# Patient Record
Sex: Female | Born: 1995 | Race: Black or African American | Hispanic: No | Marital: Single | Smoking: Never smoker
Health system: Southern US, Community
[De-identification: ages and names within clinical notes are randomized; demographics above are authoritative.]

## PROBLEM LIST (undated history)

## (undated) DIAGNOSIS — J45909 Unspecified asthma, uncomplicated: Secondary | ICD-10-CM

---

## 2016-05-18 DIAGNOSIS — J45909 Unspecified asthma, uncomplicated: Secondary | ICD-10-CM | POA: Insufficient documentation

## 2018-02-23 DIAGNOSIS — G43909 Migraine, unspecified, not intractable, without status migrainosus: Secondary | ICD-10-CM | POA: Insufficient documentation

## 2018-10-05 ENCOUNTER — Other Ambulatory Visit: Payer: Self-pay

## 2018-10-05 ENCOUNTER — Ambulatory Visit (HOSPITAL_COMMUNITY)
Admission: EM | Admit: 2018-10-05 | Discharge: 2018-10-05 | Disposition: A | Discharge status: Home / Self Care | Payer: BC Managed Care – PPO | Attending: Family Medicine | Admitting: Family Medicine

## 2018-10-05 ENCOUNTER — Encounter (HOSPITAL_COMMUNITY): Payer: Self-pay

## 2018-10-05 DIAGNOSIS — N898 Other specified noninflammatory disorders of vagina: Secondary | ICD-10-CM | POA: Diagnosis not present

## 2018-10-05 DIAGNOSIS — Z3202 Encounter for pregnancy test, result negative: Secondary | ICD-10-CM | POA: Diagnosis not present

## 2018-10-05 DIAGNOSIS — N760 Acute vaginitis: Secondary | ICD-10-CM | POA: Diagnosis not present

## 2018-10-05 HISTORY — DX: Unspecified asthma, uncomplicated: J45.909

## 2018-10-05 LAB — POCT URINALYSIS DIP (DEVICE)
Bilirubin Urine: NEGATIVE
Glucose, UA: NEGATIVE mg/dL
Hgb urine dipstick: NEGATIVE
Ketones, ur: NEGATIVE mg/dL
Leukocytes,Ua: NEGATIVE
Nitrite: NEGATIVE
Protein, ur: NEGATIVE mg/dL
Specific Gravity, Urine: >=1.03 (ref 1.005–1.030)
Urobilinogen, UA: 0.2 mg/dL (ref 0.0–1.0)
pH: 6 (ref 5.0–8.0)

## 2018-10-05 LAB — POCT PREGNANCY, URINE: Preg Test, Ur: NEGATIVE

## 2018-10-05 MED ORDER — FLUCONAZOLE 150 MG PO TABS: 150.0000 mg | ORAL_TABLET | Freq: Once | ORAL | 0 refills | Status: AC

## 2018-10-05 MED ORDER — METRONIDAZOLE 500 MG PO TABS: 500.0000 mg | ORAL_TABLET | Freq: Two times a day (BID) | ORAL | 0 refills | Status: AC

## 2018-10-05 NOTE — ED Triage Notes (Signed)
Pt states she has a clear discharge and having cramps x 2 days.

## 2018-10-05 NOTE — Discharge Instructions (Signed)
Please begin taking metronidazole twice daily for the next week.  Please not drink alcohol until 24 hours after the last tablet.  This will treat for BV. You may take 1 tablet of Diflucan today, may repeat when finishing course of Flagyl if still having symptoms.  We are testing you for Gonorrhea, Chlamydia, Trichomonas, Yeast and Bacterial Vaginosis. We will call you if anything is positive and let you know if you require any further treatment. Please inform partners of any positive results.   Please return if symptoms not improving with treatment, development of fever, nausea, vomiting, abdominal pain.

## 2018-10-05 NOTE — ED Provider Notes (Signed)
[facility]    CSN: [id number] Arrival date & time: 10/05/18  0845      History   Chief Complaint Chief Complaint  Patient presents with  . Vaginal Discharge    HPI Chelsea Underwood is a 23 y.o. female history of asthma presenting today for evaluation of vaginal discharge.  Patient states that over the past 2 days she has had an increasing amount of discharge.  She has had associated itching and some mild lower abdominal cramping.  She states that it feels like menstrual cramping but very mild.  Has had some slight nausea, but this is also been mild.  Denies vomiting.  Has maintained appetite and normal oral intake.  Denies change in bowel movements.  Last menstrual cycle was 7/25.  Is not on any form of birth control.  Denies concerns for STDs that she is in a monogamous relationship.  Denies fevers.  Denies rashes or lesions.  States it feels like BV or yeast, but she is not sure which.  Has history of both.  HPI  Past Medical History:  Diagnosis Date  . Asthma     There are no active problems to display for this patient.   History reviewed. No pertinent surgical history.  OB History   No obstetric history on file.      Home Medications    Prior to Admission medications   Medication Sig Start Date End Date Taking? Authorizing Provider  fluconazole (DIFLUCAN) 150 MG tablet Take 1 tablet (150 mg total) by mouth once for 1 dose. 10/05/18 10/05/18  Kimi Bordeau C, PA-C  metroNIDAZOLE (FLAGYL) 500 MG tablet Take 1 tablet (500 mg total) by mouth 2 (two) times daily for 7 days. 10/05/18 10/12/18  Dyron Kawano, Junius CreamerHallie C, PA-C    Family History History reviewed. No pertinent family history.  Social History Social History   Tobacco Use  . Smoking status: Never Smoker  . Smokeless tobacco: Never Used  Substance Use Topics  . Alcohol use: Yes  . Drug use: Not on file     Allergies   Amoxil [amoxicillin], Cefdinir, Peanut-containing drug products, and  Penicillins   Review of Systems Review of Systems  Constitutional: Negative for fever.  Respiratory: Negative for shortness of breath.   Cardiovascular: Negative for chest pain.  Gastrointestinal: Positive for abdominal pain. Negative for diarrhea, nausea and vomiting.  Genitourinary: Positive for vaginal discharge. Negative for dysuria, flank pain, genital sores, hematuria, menstrual problem, vaginal bleeding and vaginal pain.  Musculoskeletal: Negative for back pain.  Skin: Negative for rash.  Neurological: Negative for dizziness, light-headedness and headaches.     Physical Exam Triage Vital Signs ED Triage Vitals  Enc Vitals Group     BP 10/05/18 0941 115/71     Pulse Rate 10/05/18 0941 77     Resp 10/05/18 0941 16     Temp 10/05/18 0941 98.2 F (36.8 C)     Temp Source 10/05/18 0941 Oral     SpO2 10/05/18 0941 98 %     Weight 10/05/18 0937 203 lb (92.1 kg)     Height --      Head Circumference --      Peak Flow --      Pain Score 10/05/18 0937 3     Pain Loc --      Pain Edu? --      Excl. in [place]? --    No data found.  Updated Vital Signs BP 115/71 (BP Location: Right Arm)  Pulse 77   Temp 98.2 F (36.8 C) (Oral)   Resp 16   Wt 203 lb (92.1 kg)   LMP 09/12/2018   SpO2 98%   Visual Acuity Right Eye Distance:   Left Eye Distance:   Bilateral Distance:    Right Eye Near:   Left Eye Near:    Bilateral Near:     Physical Exam Vitals signs and nursing note reviewed.  Constitutional:      General: She is not in acute distress.    Appearance: She is well-developed.  HENT:     Head: Normocephalic and atraumatic.  Eyes:     Conjunctiva/sclera: Conjunctivae normal.  Neck:     Musculoskeletal: Neck supple.  Cardiovascular:     Rate and Rhythm: Normal rate and regular rhythm.     Heart sounds: No murmur.  Pulmonary:     Effort: Pulmonary effort is normal. No respiratory distress.     Breath sounds: Normal breath sounds.  Abdominal:     Palpations:  Abdomen is soft.     Tenderness: There is no abdominal tenderness.     Comments: Soft, nondistended, nontender light deep palpation throughout entire abdomen  Genitourinary:    Comments: Deferred Skin:    General: Skin is warm and dry.  Neurological:     Mental Status: She is alert.      UC Treatments / Results  Labs (all labs ordered are listed, but only abnormal results are displayed) Labs Reviewed  POC URINE PREG, ED  POCT URINALYSIS DIP (DEVICE)  POCT PREGNANCY, URINE  CERVICOVAGINAL ANCILLARY ONLY    EKG   Radiology No results found.  Procedures Procedures (including critical care time)  Medications Ordered in UC Medications - No data to display  Initial Impression / Assessment and Plan / UC Course  I have reviewed the triage vital signs and the nursing notes.  Pertinent labs & imaging results that were available during my care of the patient were reviewed by me and considered in my medical decision making (see chart for details).    Will empirically treat for yeast and BV today.  UA negative, pregnancy negative.  Swab obtained to confirm, will also check for STDs.  Will provide further treatment if needed.Discussed strict return precautions. Patient verbalized understanding and is agreeable with plan.  Final Clinical Impressions(s) / UC Diagnoses   Final diagnoses:  Acute vaginitis  Vaginal discharge     Discharge Instructions     Please begin taking metronidazole twice daily for the next week.  Please not drink alcohol until 24 hours after the last tablet.  This will treat for BV. You may take 1 tablet of Diflucan today, may repeat when finishing course of Flagyl if still having symptoms.  We are testing you for Gonorrhea, Chlamydia, Trichomonas, Yeast and Bacterial Vaginosis. We will call you if anything is positive and let you know if you require any further treatment. Please inform partners of any positive results.   Please return if symptoms not  improving with treatment, development of fever, nausea, vomiting, abdominal pain.    ED Prescriptions    Medication Sig Dispense Auth. Provider   fluconazole (DIFLUCAN) 150 MG tablet Take 1 tablet (150 mg total) by mouth once for 1 dose. 2 tablet Gerlene Glassburn C, PA-C   metroNIDAZOLE (FLAGYL) 500 MG tablet Take 1 tablet (500 mg total) by mouth 2 (two) times daily for 7 days. 14 tablet Lennyn Bellanca, [place] C, PA-C     Controlled Substance Prescriptions [place]  Controlled Substance Registry consulted? Not Applicable   Janith Lima, [place] 10/05/18 1022

## 2018-10-07 LAB — CERVICOVAGINAL ANCILLARY ONLY
Bacterial vaginitis: POSITIVE — AB
Candida vaginitis: POSITIVE — AB
Chlamydia: NEGATIVE
Neisseria Gonorrhea: NEGATIVE
Trichomonas: NEGATIVE

## 2019-01-23 ENCOUNTER — Encounter (HOSPITAL_COMMUNITY): Payer: Self-pay

## 2019-01-23 ENCOUNTER — Ambulatory Visit (HOSPITAL_COMMUNITY)
Admission: EM | Admit: 2019-01-23 | Discharge: 2019-01-23 | Disposition: A | Discharge status: Home / Self Care | Payer: BC Managed Care – PPO | Attending: Family Medicine | Admitting: Family Medicine

## 2019-01-23 ENCOUNTER — Ambulatory Visit
Admission: RE | Admit: 2019-01-23 | Discharge: 2019-01-23 | Disposition: A | Discharge status: Home / Self Care | Payer: BC Managed Care – PPO | Source: Ambulatory Visit

## 2019-01-23 ENCOUNTER — Other Ambulatory Visit: Payer: Self-pay

## 2019-01-23 DIAGNOSIS — Z20828 Contact with and (suspected) exposure to other viral communicable diseases: Secondary | ICD-10-CM | POA: Insufficient documentation

## 2019-01-23 DIAGNOSIS — R519 Headache, unspecified: Secondary | ICD-10-CM | POA: Insufficient documentation

## 2019-01-23 DIAGNOSIS — R110 Nausea: Secondary | ICD-10-CM | POA: Diagnosis not present

## 2019-01-23 DIAGNOSIS — Z3202 Encounter for pregnancy test, result negative: Secondary | ICD-10-CM | POA: Diagnosis not present

## 2019-01-23 DIAGNOSIS — Z20822 Contact with and (suspected) exposure to covid-19: Secondary | ICD-10-CM

## 2019-01-23 LAB — POCT PREGNANCY, URINE: Preg Test, Ur: NEGATIVE

## 2019-01-23 LAB — POC URINE PREG, ED: Preg Test, Ur: NEGATIVE

## 2019-01-23 MED ORDER — ONDANSETRON HCL 4 MG PO TABS: 4.0000 mg | ORAL_TABLET | Freq: Four times a day (QID) | ORAL | 0 refills | Status: DC

## 2019-01-23 MED ORDER — BUTALBITAL-APAP-CAFFEINE 50-325-40 MG PO TABS: 1.0000 | ORAL_TABLET | Freq: Four times a day (QID) | ORAL | 0 refills | Status: AC | PRN

## 2019-01-23 NOTE — ED Provider Notes (Signed)
[facility]    CSN: [id number] Arrival date & time: 01/23/19  1528      History   Chief Complaint Chief Complaint  Patient presents with  . Migraine    HPI Chelsea Underwood is a 23 y.o. female.   HPI  Patient's had a headache for 2 weeks.  She states it is worse than her usual headaches.  Itself is on the left side.  Its they are present almost every day.  She has tried ibuprofen 800 mg.  She has tried Excedrin Migraine.  Neither of these things have helped.  She denies head injury.  Denies infection symptoms such as cough cold runny nose or sinus drainage.  No fever chills or body aches.  No concern for coronavirus.  I explained her that headache can be coronavirus and she agrees to coronavirus testing. Patient states that she has some mild nausea.  No vomiting.  No visual changes.  No neuro changes.  No head injury.  Past Medical History:  Diagnosis Date  . Asthma     There are no active problems to display for this patient.   History reviewed. No pertinent surgical history.  OB History   No obstetric history on file.      Home Medications    Prior to Admission medications   Medication Sig Start Date End Date Taking? Authorizing Provider  butalbital-acetaminophen-caffeine (FIORICET) 50-325-40 MG tablet Take 1-2 tablets by mouth every 6 (six) hours as needed for headache. 01/23/19 01/23/20  Eustace MooreNelson, Curties Conigliaro Sue, MD  ondansetron (ZOFRAN) 4 MG tablet Take 1 tablet (4 mg total) by mouth every 6 (six) hours. 01/23/19   Eustace MooreNelson, Angelicia Lessner Sue, MD    Family History History reviewed. No pertinent family history.  Social History Social History   Tobacco Use  . Smoking status: Never Smoker  . Smokeless tobacco: Never Used  Substance Use Topics  . Alcohol use: Yes  . Drug use: Not on file     Allergies   Amoxil [amoxicillin], Cefdinir, Peanut-containing drug products, and Penicillins   Review of Systems Review of Systems  Constitutional: Negative  for chills and fever.  HENT: Negative for ear pain and sore throat.   Eyes: Negative for pain and visual disturbance.  Respiratory: Negative for cough and shortness of breath.   Cardiovascular: Negative for chest pain and palpitations.  Gastrointestinal: Positive for nausea. Negative for abdominal pain and vomiting.  Genitourinary: Negative for dysuria and hematuria.  Musculoskeletal: Negative for arthralgias and back pain.  Skin: Negative for color change and rash.  Neurological: Positive for headaches. Negative for seizures and syncope.  All other systems reviewed and are negative.    Physical Exam Triage Vital Signs ED Triage Vitals  Enc Vitals Group     BP 01/23/19 1629 98/78     Pulse Rate 01/23/19 1629 82     Resp 01/23/19 1629 17     Temp 01/23/19 1629 98.9 F (37.2 C)     Temp Source 01/23/19 1629 Oral     SpO2 01/23/19 1629 100 %     Weight --      Height --      Head Circumference --      Peak Flow --      Pain Score 01/23/19 1630 6     Pain Loc --      Pain Edu? --      Excl. in GC? --    No data found.  Updated Vital Signs BP 98/78 (BP Location:  Right Arm)   Pulse 82   Temp 98.9 F (37.2 C) (Oral)   Resp 17   LMP 12/27/2018   SpO2 100%      Physical Exam Constitutional:      General: She is not in acute distress.    Appearance: She is well-developed.  HENT:     Head: Normocephalic and atraumatic.     Right Ear: Tympanic membrane and ear canal normal.     Left Ear: Tympanic membrane and ear canal normal.     Nose: Nose normal. No congestion.     Mouth/Throat:     Mouth: Mucous membranes are moist.  Eyes:     Conjunctiva/sclera: Conjunctivae normal.     Pupils: Pupils are equal, round, and reactive to light.  Neck:     Musculoskeletal: Normal range of motion.  Cardiovascular:     Rate and Rhythm: Normal rate.     Heart sounds: Normal heart sounds.  Pulmonary:     Effort: Pulmonary effort is normal. No respiratory distress.     Breath  sounds: Normal breath sounds.  Abdominal:     General: There is no distension.     Palpations: Abdomen is soft.  Musculoskeletal: Normal range of motion.  Skin:    General: Skin is warm and dry.  Neurological:     General: No focal deficit present.     Mental Status: She is alert.     Cranial Nerves: No cranial nerve deficit.     Sensory: No sensory deficit.     Motor: No weakness.     Coordination: Coordination normal.     Gait: Gait normal.     Deep Tendon Reflexes: Reflexes normal.  Psychiatric:        Mood and Affect: Mood normal.        Behavior: Behavior normal.    Semination is unremarkable  UC Treatments / Results  Labs (all labs ordered are listed, but only abnormal results are displayed) Labs Reviewed  NOVEL CORONAVIRUS, NAA (HOSP ORDER, SEND-OUT TO REF LAB; TAT 18-24 HRS)  POC URINE PREG, ED  POCT PREGNANCY, URINE   Pregnancy test is negative EKG   Radiology No results found.  Procedures Procedures (including critical care time)  Medications Ordered in UC Medications - No data to display  Initial Impression / Assessment and Plan / UC Course  I have reviewed the triage vital signs and the nursing notes.  Pertinent labs & imaging results that were available during my care of the patient were reviewed by me and considered in my medical decision making (see chart for details).     Reviewed with patient her prescription medicine is stronger, and contains a controlled substance.  This will not be refilled. Needs PCP. Coronavirus send out test is pending.  Recommend quarantine until test result is available Final Clinical Impressions(s) / UC Diagnoses   Final diagnoses:  Bad headache  Suspected COVID-19 virus infection     Discharge Instructions     When you have a headache, take the headache medication and lie down to rest Take Zofran as needed for nausea Follow-up with your primary care doctor for medicine refills or additional work-up   ED  Prescriptions    Medication Sig Dispense Auth. Provider   butalbital-acetaminophen-caffeine (FIORICET) 50-325-40 MG tablet Take 1-2 tablets by mouth every 6 (six) hours as needed for headache. 25 tablet Raylene Everts, MD   ondansetron (ZOFRAN) 4 MG tablet Take 1 tablet (4 mg total) by mouth every  6 (six) hours. 12 tablet Eustace Moore, MD     I have reviewed the PDMP during this encounter.   Eustace Moore, MD 01/23/19 [phone]

## 2019-01-23 NOTE — ED Triage Notes (Signed)
Pt presents with ongoing migraine X 2 weeks with no relief with OTC medication.

## 2019-01-23 NOTE — Discharge Instructions (Signed)
When you have a headache, take the headache medication and lie down to rest Take Zofran as needed for nausea Follow-up with your primary care doctor for medicine refills or additional work-up

## 2019-01-25 LAB — NOVEL CORONAVIRUS, NAA (HOSP ORDER, SEND-OUT TO REF LAB; TAT 18-24 HRS): SARS-CoV-2, NAA: NOT DETECTED

## 2019-04-13 ENCOUNTER — Encounter: Payer: Self-pay | Admitting: Emergency Medicine

## 2019-04-13 ENCOUNTER — Emergency Department
Admission: EM | Admit: 2019-04-13 | Discharge: 2019-04-13 | Disposition: A | Discharge status: Home / Self Care | Payer: BC Managed Care – PPO | Attending: Emergency Medicine | Admitting: Emergency Medicine

## 2019-04-13 ENCOUNTER — Other Ambulatory Visit: Payer: Self-pay

## 2019-04-13 DIAGNOSIS — Y999 Unspecified external cause status: Secondary | ICD-10-CM | POA: Diagnosis not present

## 2019-04-13 DIAGNOSIS — Y9389 Activity, other specified: Secondary | ICD-10-CM | POA: Insufficient documentation

## 2019-04-13 DIAGNOSIS — Z91010 Allergy to peanuts: Secondary | ICD-10-CM | POA: Insufficient documentation

## 2019-04-13 DIAGNOSIS — S3992XA Unspecified injury of lower back, initial encounter: Secondary | ICD-10-CM | POA: Diagnosis present

## 2019-04-13 DIAGNOSIS — M546 Pain in thoracic spine: Secondary | ICD-10-CM

## 2019-04-13 DIAGNOSIS — T148XXA Other injury of unspecified body region, initial encounter: Secondary | ICD-10-CM

## 2019-04-13 DIAGNOSIS — S39012A Strain of muscle, fascia and tendon of lower back, initial encounter: Secondary | ICD-10-CM | POA: Insufficient documentation

## 2019-04-13 DIAGNOSIS — J45909 Unspecified asthma, uncomplicated: Secondary | ICD-10-CM | POA: Insufficient documentation

## 2019-04-13 DIAGNOSIS — X501XXA Overexertion from prolonged static or awkward postures, initial encounter: Secondary | ICD-10-CM | POA: Diagnosis not present

## 2019-04-13 DIAGNOSIS — Y9289 Other specified places as the place of occurrence of the external cause: Secondary | ICD-10-CM | POA: Insufficient documentation

## 2019-04-13 MED ORDER — METHOCARBAMOL 500 MG PO TABS: 500.0000 mg | ORAL_TABLET | Freq: Three times a day (TID) | ORAL | 0 refills | Status: DC

## 2019-04-13 MED ORDER — KETOROLAC TROMETHAMINE 30 MG/ML IJ SOLN
30.0000 mg | Freq: Once | INTRAMUSCULAR | Status: AC
  Administered 2019-04-13: 30 mg via INTRAMUSCULAR
  Filled 2019-04-13: qty 1

## 2019-04-13 MED ORDER — NAPROXEN 500 MG PO TABS: 500.0000 mg | ORAL_TABLET | Freq: Two times a day (BID) | ORAL | 2 refills | Status: DC

## 2019-04-13 NOTE — ED Provider Notes (Signed)
[facility] Emergency Department Provider Note   ____________________________________________    I have reviewed the triage vital signs and the nursing notes.   HISTORY  Chief Complaint Back Pain     HPI Chelsea Underwood is a 24 y.o. female who presents with complaints of back pain.  Patient describes lower thoracic back pain, worse on the right which is been present for 2 days.  She has been taking ibuprofen intermittently.  No neuro deficits.  No incontinence.  No abdominal pain.  No nausea or vomiting.  No IV drug use.  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 Asthma     There are no problems to display for this patient.   History reviewed. No pertinent surgical history.  Prior to Admission medications   Medication Sig Start Date End Date Taking? Authorizing Provider  butalbital-acetaminophen-caffeine (FIORICET) 50-325-40 MG tablet Take 1-2 tablets by mouth every 6 (six) hours as needed for headache. 01/23/19 01/23/20  Raylene Everts, MD  methocarbamol (ROBAXIN) 500 MG tablet Take 1 tablet (500 mg total) by mouth 3 (three) times daily. 04/13/19   Lavonia Drafts, MD  naproxen (NAPROSYN) 500 MG tablet Take 1 tablet (500 mg total) by mouth 2 (two) times daily with a meal. 04/13/19   Lavonia Drafts, MD  ondansetron (ZOFRAN) 4 MG tablet Take 1 tablet (4 mg total) by mouth every 6 (six) hours. 01/23/19   Raylene Everts, MD     Allergies Amoxil [amoxicillin], Cefdinir, Peanut-containing drug products, and Penicillins  No family history on file.  Social History Social History   Tobacco Use  . Smoking status: Never Smoker  . Smokeless tobacco: Never Used  Substance Use Topics  . Alcohol use: Yes  . Drug use: Not on file    Review of Systems  Constitutional: No fever/chills     Gastrointestinal: No abdominal pain.  No  nausea, no vomiting.   Genitourinary: Negative for incontinence, no dysuria Musculoskeletal: As above Skin: Negative for rash. Neurological: Negative for headaches, no neuro deficits    ____________________________________________   PHYSICAL EXAM:  VITAL SIGNS: ED Triage Vitals  Enc Vitals Group     BP 04/13/19 0106 100/62     Pulse Rate 04/13/19 0106 75     Resp 04/13/19 0106 20     Temp 04/13/19 0106 98 F (36.7 C)     Temp Source 04/13/19 0106 Oral     SpO2 04/13/19 0106 98 %     Weight 04/13/19 0106 96.2 kg (212 lb)     Height 04/13/19 0106 1.6 m (5\' 3" )     Head Circumference --      Peak Flow --      Pain Score 04/13/19 0105 8     Pain Loc --      Pain Edu? --      Excl. in [place]? --      Constitutional: Alert and oriented. No acute distress. Pleasant and interactive  Nose: No congestion/rhinnorhea. Mouth/Throat: Mucous membranes are moist.   Cardiovascular: Normal rate, regular rhythm.  Respiratory: Normal respiratory effort.  No retractions. Genitourinary: deferred Musculoskeletal: Back: No vertebral tenderness palpation, lower thoracic right paravertebral tenderness palpation consistent with sprain/spasm.  Normal strength in the lower extremities.  No saddle anesthesia Neurologic:  Normal speech and language. No gross focal neurologic deficits are appreciated.  Skin:  Skin is warm, dry and intact. No rash noted.   ____________________________________________   LABS (all labs ordered are listed, but only abnormal results are displayed)  Labs Reviewed - No data to display ____________________________________________  EKG   ____________________________________________  RADIOLOGY  None ____________________________________________   PROCEDURES  Procedure(s) performed: No  Procedures   Critical Care performed: No ____________________________________________   INITIAL IMPRESSION / ASSESSMENT AND PLAN / ED COURSE  Pertinent labs & imaging  results that were available during my care of the patient were reviewed by me and considered in my medical decision making (see chart for details).  Patient symptoms consistent with muscle strain, no alarming symptoms.  Will treat conservatively with Naprosyn and muscle relaxer, outpatient follow-up recommended.   ____________________________________________   FINAL CLINICAL IMPRESSION(S) / ED DIAGNOSES  Final diagnoses:  Muscle strain  Acute right-sided thoracic back pain      NEW MEDICATIONS STARTED DURING THIS VISIT:  Discharge Medication List as of 04/13/2019  2:08 AM    START taking these medications   Details  methocarbamol (ROBAXIN) 500 MG tablet Take 1 tablet (500 mg total) by mouth 3 (three) times daily., Starting Thu 04/13/2019, Normal    naproxen (NAPROSYN) 500 MG tablet Take 1 tablet (500 mg total) by mouth 2 (two) times daily with a meal., Starting Thu 04/13/2019, Normal         Note:  This document was prepared using Dragon voice recognition software and may include unintentional dictation errors.   Jene Every, MD 04/13/19 [phone]

## 2019-04-13 NOTE — ED Triage Notes (Signed)
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

## 2019-04-13 NOTE — ED Notes (Signed)
Per patient having back pain from base of neck to middle of back with pain shooting down to lower back. Pain is on right side. No relief with OTC medications. Pain 8/10.

## 2020-04-06 ENCOUNTER — Emergency Department: Payer: BC Managed Care – PPO

## 2020-04-06 ENCOUNTER — Emergency Department
Admission: EM | Admit: 2020-04-06 | Discharge: 2020-04-06 | Disposition: A | Discharge status: Home / Self Care | Payer: BC Managed Care – PPO | Attending: Emergency Medicine | Admitting: Emergency Medicine

## 2020-04-06 ENCOUNTER — Other Ambulatory Visit: Payer: Self-pay

## 2020-04-06 DIAGNOSIS — Z91010 Allergy to peanuts: Secondary | ICD-10-CM | POA: Insufficient documentation

## 2020-04-06 DIAGNOSIS — J45909 Unspecified asthma, uncomplicated: Secondary | ICD-10-CM | POA: Diagnosis not present

## 2020-04-06 DIAGNOSIS — S46011A Strain of muscle(s) and tendon(s) of the rotator cuff of right shoulder, initial encounter: Secondary | ICD-10-CM | POA: Diagnosis not present

## 2020-04-06 DIAGNOSIS — S4991XA Unspecified injury of right shoulder and upper arm, initial encounter: Secondary | ICD-10-CM | POA: Diagnosis present

## 2020-04-06 DIAGNOSIS — Y9351 Activity, roller skating (inline) and skateboarding: Secondary | ICD-10-CM | POA: Diagnosis not present

## 2020-04-06 DIAGNOSIS — W19XXXA Unspecified fall, initial encounter: Secondary | ICD-10-CM

## 2020-04-06 DIAGNOSIS — S46911A Strain of unspecified muscle, fascia and tendon at shoulder and upper arm level, right arm, initial encounter: Secondary | ICD-10-CM

## 2020-04-06 IMAGING — CR DG SHOULDER 2+V*R*
1 series · 3 of 3 positions shown · non-contrast
Comparison: None.

CLINICAL DATA: Fell while roller skating.  Shoulder pain.

EXAM:
RIGHT SHOULDER - 2+ VIEW

[Series 1: dg shoulder right · 0.14mm/px · 3 of 3 slices shown]
[im 1/3]
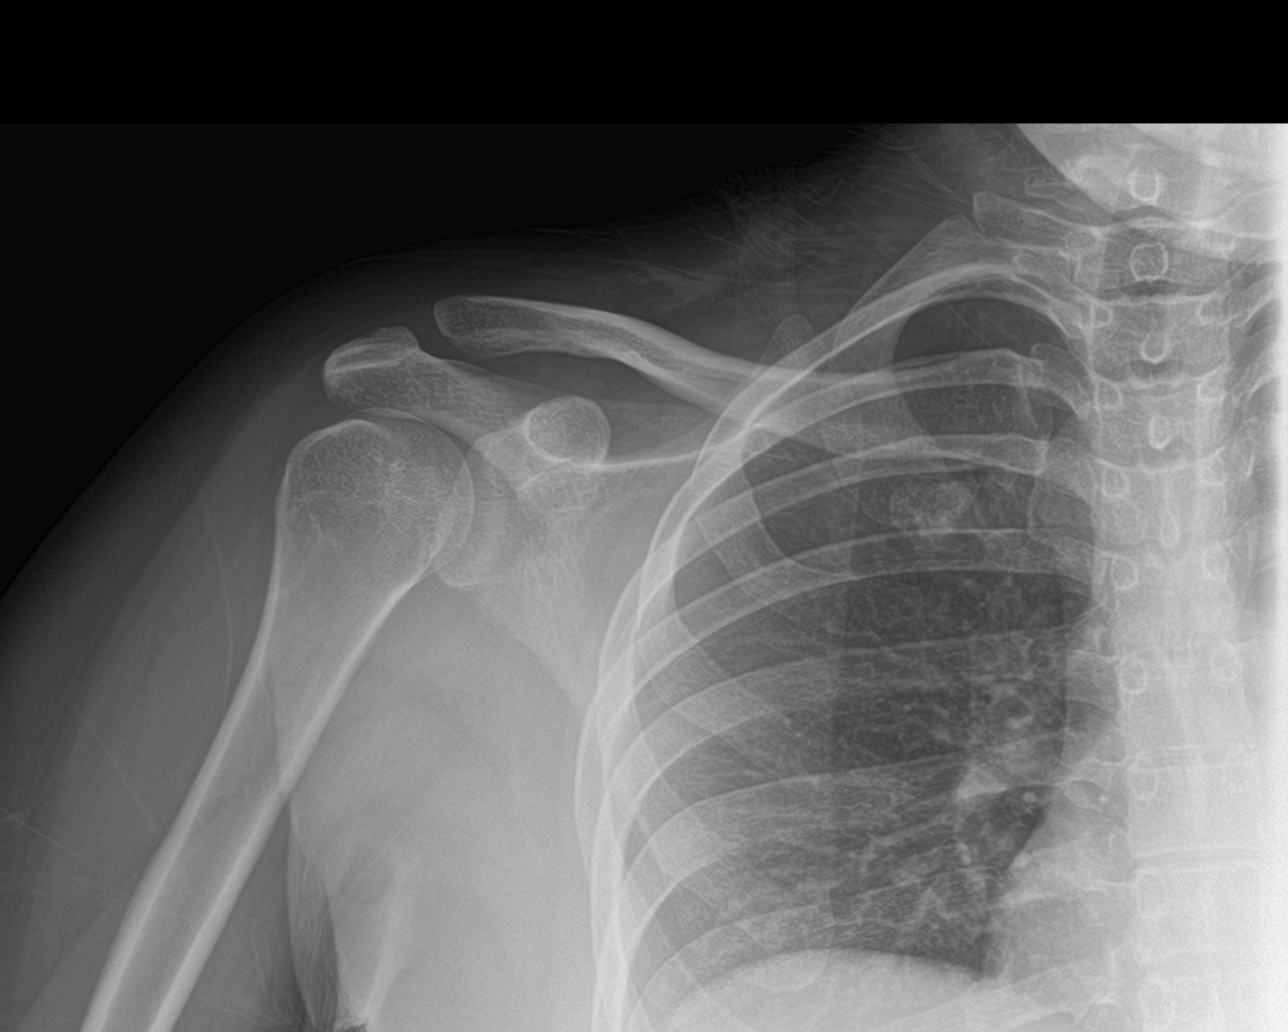
[im 2/3]
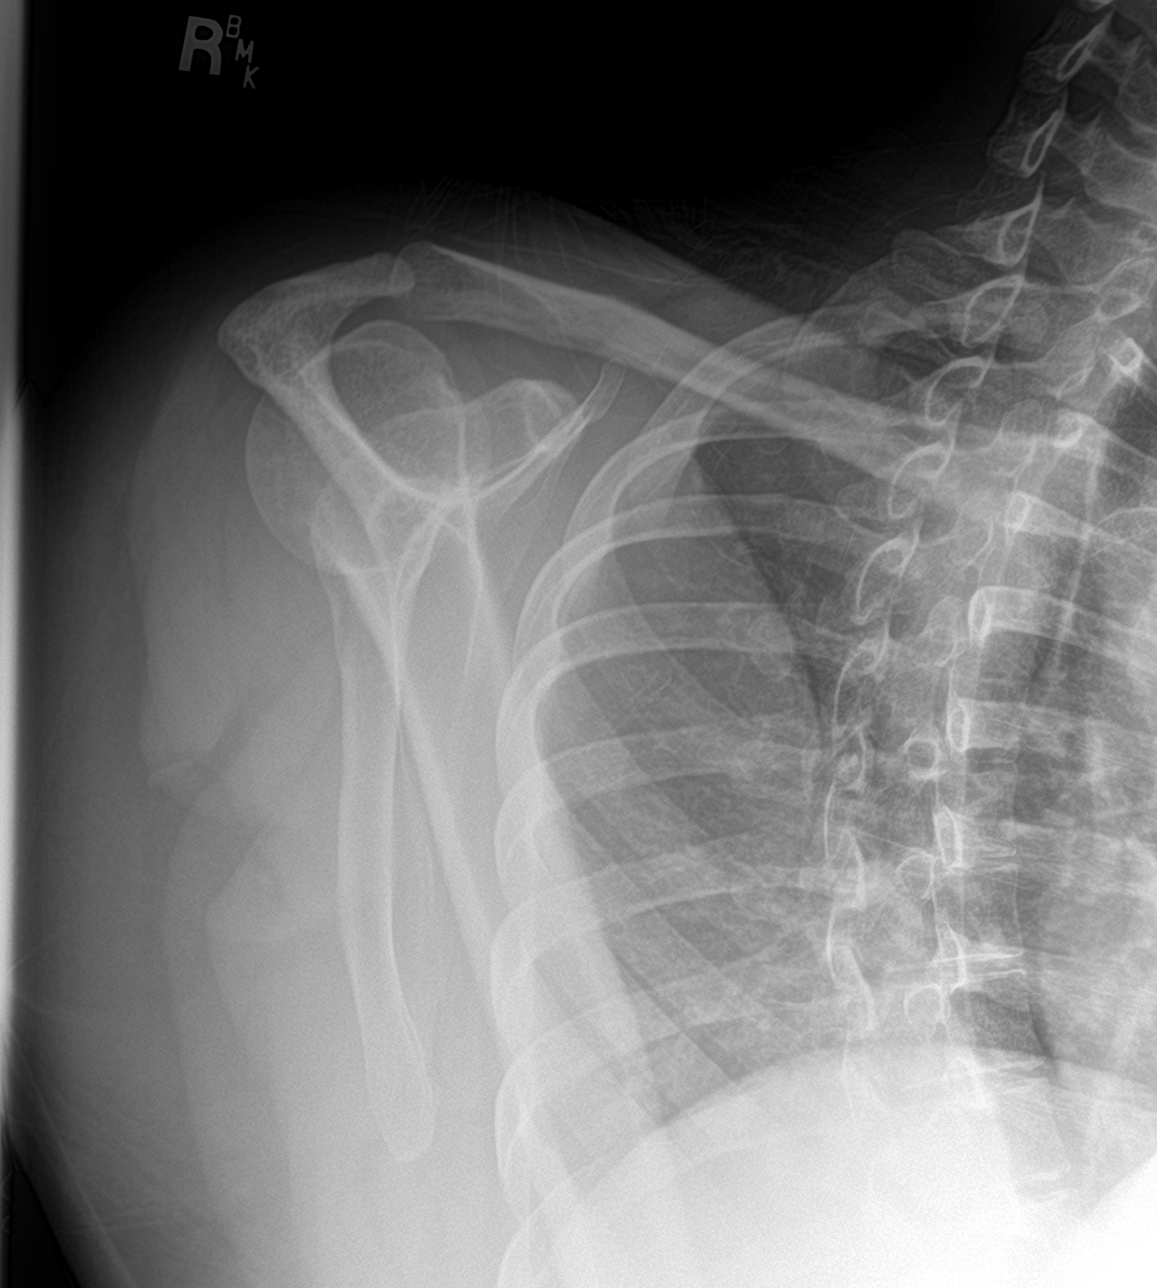
[im 3/3]
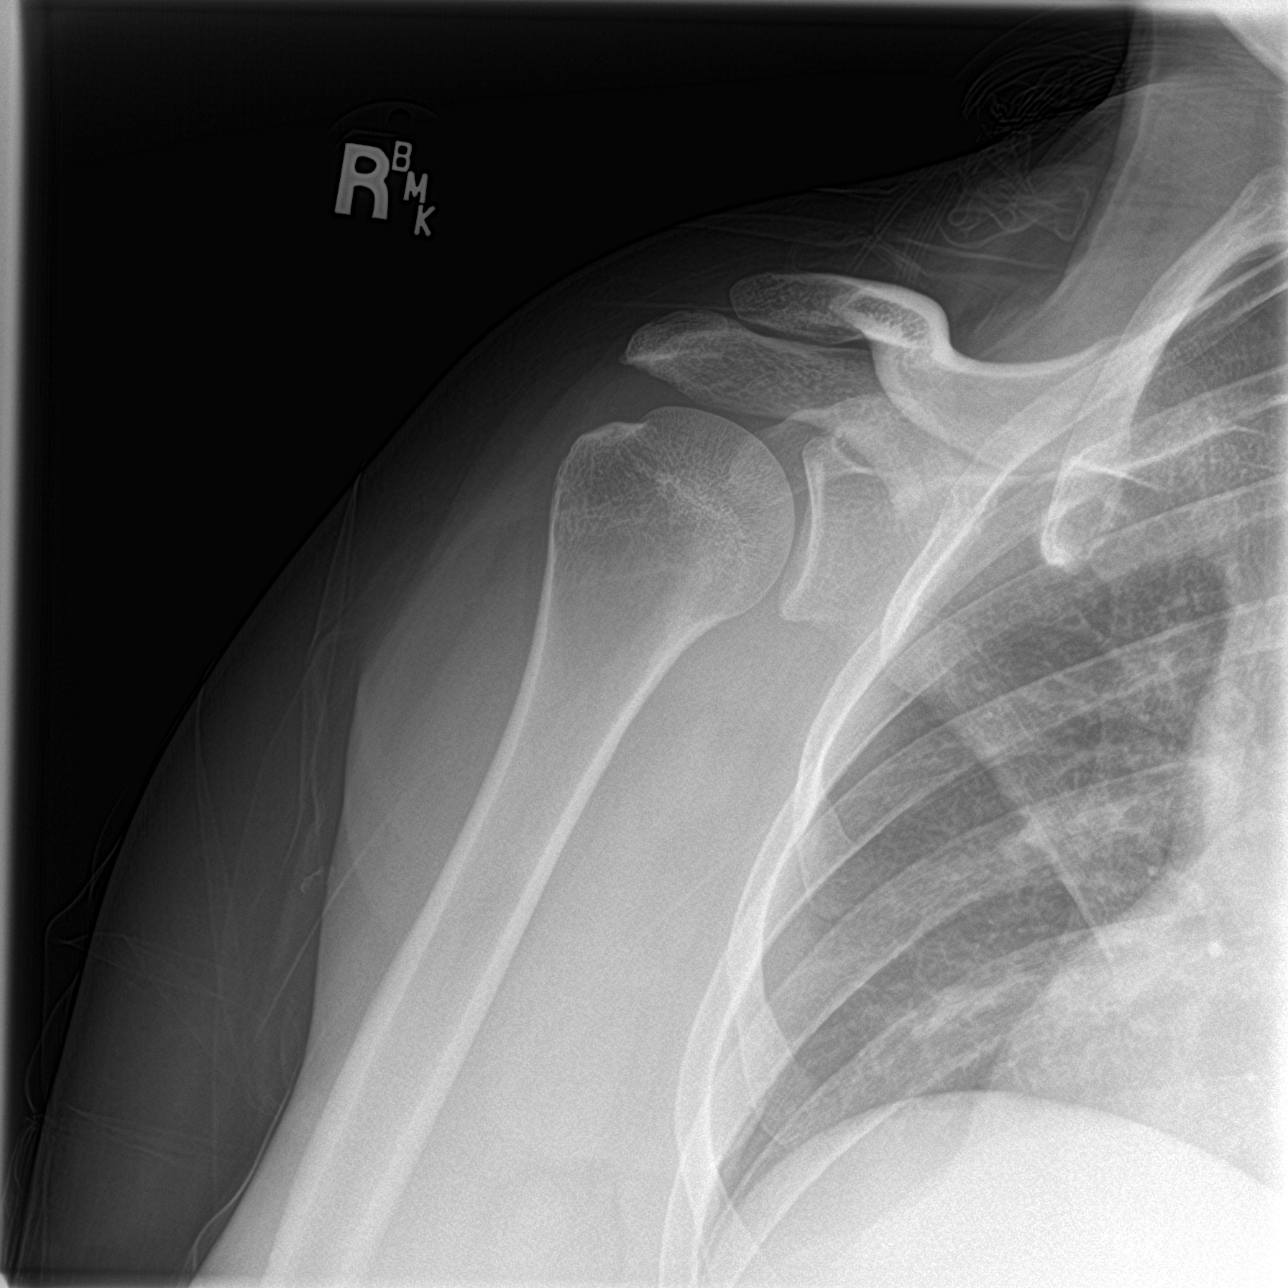

[3 of 3 positions shown; findings below may reference images not displayed]

FINDINGS: Glenohumeral joint is normal. No evidence of regional fracture.
Borderline minimal widening of the AC joint without elevation of
clavicle could indicate low-grade AC ligament strain.
IMPRESSION: No fracture or dislocation. Borderline widening of the AC joint
could indicate low-grade AC ligament strain.

## 2020-04-06 MED ORDER — IBUPROFEN 600 MG PO TABS
600.0000 mg | ORAL_TABLET | Freq: Once | ORAL | Status: AC
  Administered 2020-04-06: 600 mg via ORAL
  Filled 2020-04-06: qty 1

## 2020-04-06 MED ORDER — IBUPROFEN 600 MG PO TABS: 600.0000 mg | ORAL_TABLET | Freq: Four times a day (QID) | ORAL | 0 refills | Status: DC | PRN

## 2020-04-06 NOTE — ED Provider Notes (Signed)
[facility] Emergency Department Provider Note  ____________________________________________  Time seen: Approximately 1:58 AM  I have reviewed the triage vital signs and the nursing notes.   HISTORY  Chief Complaint Fall   HPI Chelsea Underwood is a 25 y.o. female who presents for evaluation of right shoulder pain status post mechanical fall.  Patient was rollerskating when she fell onto her shoulder.  Pain is worse with movement and has been constant since the fall.  Pain is moderate in intensity.  She denies head trauma or LOC.  No neck pain or back pain.  No other extremity pain.  No chest pain or abdominal pain   Past Medical History:  Diagnosis Date  . Asthma      Prior to Admission medications   Medication Sig Start Date End Date Taking? Authorizing Provider  ibuprofen (ADVIL) 600 MG tablet Take 1 tablet (600 mg total) by mouth every 6 (six) hours as needed. 04/06/20  Yes Don Perking, [place], MD  methocarbamol (ROBAXIN) 500 MG tablet Take 1 tablet (500 mg total) by mouth 3 (three) times daily. 04/13/19   Jene Every, MD  naproxen (NAPROSYN) 500 MG tablet Take 1 tablet (500 mg total) by mouth 2 (two) times daily with a meal. 04/13/19   Jene Every, MD  ondansetron (ZOFRAN) 4 MG tablet Take 1 tablet (4 mg total) by mouth every 6 (six) hours. 01/23/19   Eustace Moore, MD    Allergies Amoxil [amoxicillin], Cefdinir, Peanut-containing drug products, and Penicillins  No family history on file.  Social History Social History   Tobacco Use  . Smoking status: Never Smoker  . Smokeless tobacco: Never Used  Substance Use Topics  . Alcohol use: Yes    Review of Systems  Constitutional: Negative for fever. Eyes: Negative for visual changes. ENT: Negative for facial injury or neck injury Cardiovascular: Negative for chest injury. Respiratory: Negative for shortness of breath. Negative for chest wall injury. Gastrointestinal: Negative  for abdominal pain or injury. Genitourinary: Negative for dysuria. Musculoskeletal: Negative for back injury, + R shoulder pain. Skin: Negative for laceration/abrasions. Neurological: Negative for head injury.   ____________________________________________   PHYSICAL EXAM:  VITAL SIGNS: ED Triage Vitals  Enc Vitals Group     BP 04/06/20 0056 125/77     Pulse Rate 04/06/20 0056 99     Resp 04/06/20 0056 17     Temp 04/06/20 0056 98.8 F (37.1 C)     Temp Source 04/06/20 0056 Oral     SpO2 04/06/20 0056 100 %     Weight 04/06/20 0057 210 lb (95.3 kg)     Height 04/06/20 0057 5\' 3"  (1.6 m)     Head Circumference --      Peak Flow --      Pain Score 04/06/20 0057 8     Pain Loc --      Pain Edu? --      Excl. in GC? --     Full spinal precautions maintained throughout the trauma exam. Constitutional: Alert and oriented. No acute distress. Does not appear intoxicated. HEENT Head: Normocephalic and atraumatic. Face: No facial bony tenderness. Stable midface Ears: No hemotympanum bilaterally. No Battle sign Eyes: No eye injury. PERRL. No raccoon eyes Nose: Nontender. No epistaxis. No rhinorrhea Mouth/Throat: Mucous membranes are moist. No oropharyngeal blood. No dental injury. Airway patent without stridor. Normal voice. Neck: no C-collar. No midline c-spine tenderness.  Cardiovascular: Normal rate, regular rhythm. Normal and symmetric distal pulses are present in  all extremities. Pulmonary/Chest: Chest wall is stable and nontender to palpation/compression. Normal respiratory effort. Breath sounds are normal. No crepitus.  Abdominal: Soft, nontender, non distended. Musculoskeletal: Tenderness to palpation on the anterior aspect of the right shoulder. nontender with normal full range of motion in all extremities. No deformities. No thoracic or lumbar midline spinal tenderness. Pelvis is stable. Skin: Skin is warm, dry and intact. No abrasions or contutions. Psychiatric: Speech  and behavior are appropriate. Neurological: Normal speech and language. Moves all extremities to command. No gross focal neurologic deficits are appreciated.  Glascow Coma Score: 4 - Opens eyes on own 6 - Follows simple motor commands 5 - Alert and oriented GCS: 15   ____________________________________________   LABS (all labs ordered are listed, but only abnormal results are displayed)  Labs Reviewed - No data to display ____________________________________________  EKG  none  ____________________________________________  RADIOLOGY  I have personally reviewed the images performed during this visit and I agree with the Radiologist's read.   Interpretation by Radiologist:  DG Shoulder Right  Result Date: 04/06/2020 CLINICAL DATA:  Larey Seat while roller skating.  Shoulder pain. EXAM: RIGHT SHOULDER - 2+ VIEW COMPARISON:  None. FINDINGS: Glenohumeral joint is normal. No evidence of regional fracture. Borderline minimal widening of the [facility] joint without elevation of clavicle could indicate low-grade AC ligament strain. IMPRESSION: No fracture or dislocation. Borderline widening of the [facility] joint could indicate low-grade AC ligament strain. Electronically Signed   By: Paulina Fusi M.D.   On: 04/06/2020 01:45     ____________________________________________   PROCEDURES  Procedure(s) performed: None Procedures Critical Care performed:  None ____________________________________________   INITIAL IMPRESSION / ASSESSMENT AND PLAN / ED COURSE   25 y.o. female who presents for evaluation of right shoulder pain status post mechanical fall.  No obvious deformity on exam.  Chest x-ray visualized by me showing no evidence of fracture or dislocation.  Questionable shoulder sprain.  Patient was given a sling, prescription for ibuprofen, discussed range of motion exercises starting in 24 hours.  In the meantime ice, rest, and anti-inflammatories.  Discussed my standard return  precautions.       ____________________________________________  Please note:  Patient was evaluated in Emergency Department today for the symptoms described in the history of present illness. Patient was evaluated in the context of the global COVID-19 pandemic, which necessitated consideration that the patient might be at risk for infection with the SARS-CoV-2 virus that causes COVID-19. Institutional protocols and algorithms that pertain to the evaluation of patients at risk for COVID-19 are in a state of rapid change based on information released by regulatory bodies including the CDC and federal and state organizations. These policies and algorithms were followed during the patient's care in the ED.  Some ED evaluations and interventions may be delayed as a result of limited staffing during the pandemic.   ____________________________________________   FINAL CLINICAL IMPRESSION(S) / ED DIAGNOSES   Final diagnoses:  Fall, initial encounter  Strain of right shoulder, initial encounter      NEW MEDICATIONS STARTED DURING THIS VISIT:  ED Discharge Orders         Ordered    ibuprofen (ADVIL) 600 MG tablet  Every 6 hours PRN        04/06/20 0158           Note:  This document was prepared using Dragon voice recognition software and may include unintentional dictation errors.    Don Perking, [place], MD 04/06/20 0201

## 2020-04-06 NOTE — ED Triage Notes (Signed)
Patient reports falling while roller skating.  Patient reports right shoulder pain.  Reports tenderness to right side of head but states doesn't remember hitting head.

## 2020-06-27 ENCOUNTER — Other Ambulatory Visit: Payer: Self-pay

## 2020-06-27 ENCOUNTER — Encounter: Payer: Self-pay | Admitting: Emergency Medicine

## 2020-06-27 ENCOUNTER — Emergency Department
Admission: EM | Admit: 2020-06-27 | Discharge: 2020-06-27 | Disposition: A | Discharge status: Home / Self Care | Payer: BC Managed Care – PPO | Attending: Emergency Medicine | Admitting: Emergency Medicine

## 2020-06-27 DIAGNOSIS — N390 Urinary tract infection, site not specified: Secondary | ICD-10-CM

## 2020-06-27 DIAGNOSIS — J45909 Unspecified asthma, uncomplicated: Secondary | ICD-10-CM | POA: Insufficient documentation

## 2020-06-27 DIAGNOSIS — Z91010 Allergy to peanuts: Secondary | ICD-10-CM | POA: Insufficient documentation

## 2020-06-27 DIAGNOSIS — R300 Dysuria: Secondary | ICD-10-CM | POA: Diagnosis present

## 2020-06-27 LAB — URINALYSIS, COMPLETE (UACMP) WITH MICROSCOPIC
Bilirubin Urine: NEGATIVE
Glucose, UA: NEGATIVE mg/dL
Ketones, ur: 5 mg/dL — AB
Nitrite: NEGATIVE
Protein, ur: 30 mg/dL — AB
Specific Gravity, Urine: 1.026 (ref 1.005–1.030)
pH: 5 (ref 5.0–8.0)

## 2020-06-27 LAB — POC URINE PREG, ED: Preg Test, Ur: NEGATIVE

## 2020-06-27 MED ORDER — NITROFURANTOIN MONOHYD MACRO 100 MG PO CAPS
100.0000 mg | ORAL_CAPSULE | Freq: Once | ORAL | Status: AC
  Administered 2020-06-27: 100 mg via ORAL
  Filled 2020-06-27: qty 1

## 2020-06-27 MED ORDER — NITROFURANTOIN MONOHYD MACRO 100 MG PO CAPS: 100.0000 mg | ORAL_CAPSULE | Freq: Two times a day (BID) | ORAL | 0 refills | Status: DC

## 2020-06-27 MED ORDER — PHENAZOPYRIDINE HCL 95 MG PO TABS: 95.0000 mg | ORAL_TABLET | Freq: Three times a day (TID) | ORAL | 0 refills | Status: DC | PRN

## 2020-06-27 NOTE — ED Triage Notes (Signed)
Pt arrived via POV with reports of dysuria x 2 days, pt taking AZO at home for the pain.

## 2020-06-27 NOTE — Discharge Instructions (Signed)
You may alternate Tylenol 1000 mg every 6 hours as needed for pain, fever and Ibuprofen 800 mg every 8 hours as needed for pain, fever.  Please take Ibuprofen with food.  Do not take more than 4000 mg of Tylenol (acetaminophen) in a 24 hour period.  

## 2020-06-28 NOTE — ED Provider Notes (Signed)
[facility] Emergency Department Provider Note  ____________________________________________   Event Date/Time   First MD Initiated Contact with Patient 06/27/20 2307     (approximate)  I have reviewed the triage vital signs and the nursing notes.   HISTORY  Chief Complaint Dysuria    HPI Chelsea Underwood is a 25 y.o. female with history of asthma who presents to the emergency department with complaints of 2 to 3 days of dysuria, urinary frequency and urgency, suprapubic pressure.  No fevers, flank pain, nausea, vomiting or diarrhea.  States she was concerned that she had a UTI.  No abnormal vaginal bleeding or discharge.  Last menstrual period 06/12/2020.  States her last UTI was years ago.  She cannot recall which antibiotic has helped her before.  States she has an allergy to amoxicillin and cefdinir.        Past Medical History:  Diagnosis Date  . Asthma     There are no problems to display for this patient.   History reviewed. No pertinent surgical history.  Prior to Admission medications   Medication Sig Start Date End Date Taking? Authorizing Provider  nitrofurantoin, macrocrystal-monohydrate, (MACROBID) 100 MG capsule Take 1 capsule (100 mg total) by mouth 2 (two) times daily for 7 days. 06/27/20 07/04/20 Yes Atira Borello, Layla Maw, DO  phenazopyridine (PYRIDIUM) 95 MG tablet Take 1 tablet (95 mg total) by mouth 3 (three) times daily as needed for pain. 06/27/20  Yes Lorette Peterkin, Layla Maw, DO  ibuprofen (ADVIL) 600 MG tablet Take 1 tablet (600 mg total) by mouth every 6 (six) hours as needed. 04/06/20   Nita Sickle, MD  methocarbamol (ROBAXIN) 500 MG tablet Take 1 tablet (500 mg total) by mouth 3 (three) times daily. 04/13/19   Jene Every, MD  naproxen (NAPROSYN) 500 MG tablet Take 1 tablet (500 mg total) by mouth 2 (two) times daily with a meal. 04/13/19   Jene Every, MD  ondansetron (ZOFRAN) 4 MG tablet Take 1 tablet (4 mg total) by mouth every  6 (six) hours. 01/23/19   Eustace Moore, MD    Allergies Amoxicillin, Cefdinir, Other, Peanut butter flavor, Peanut oil, Penicillin v, Apple, Peanut-containing drug products, and Penicillins  No family history on file.  Social History Social History   Tobacco Use  . Smoking status: Never Smoker  . Smokeless tobacco: Never Used  Substance Use Topics  . Alcohol use: Yes    Review of Systems Constitutional: No fever. Eyes: No visual changes. ENT: No sore throat. Cardiovascular: Denies chest pain. Respiratory: Denies shortness of breath. Gastrointestinal: No nausea, vomiting, diarrhea. Genitourinary: + dysuria. Musculoskeletal: Negative for back pain. Skin: Negative for rash. Neurological: Negative for focal weakness or numbness.  ____________________________________________   PHYSICAL EXAM:  VITAL SIGNS: ED Triage Vitals  Enc Vitals Group     BP 06/27/20 2117 129/69     Pulse Rate 06/27/20 2117 78     Resp 06/27/20 2117 16     Temp 06/27/20 2117 98.7 F (37.1 C)     Temp Source 06/27/20 2117 Oral     SpO2 06/27/20 2117 100 %     Weight 06/27/20 2123 203 lb (92.1 kg)     Height 06/27/20 2123 5\' 3"  (1.6 m)     Head Circumference --      Peak Flow --      Pain Score 06/27/20 2123 6     Pain Loc --      Pain Edu? --  Excl. in GC? --    CONSTITUTIONAL: Alert and oriented and responds appropriately to questions. Well-appearing; well-nourished HEAD: Normocephalic EYES: Conjunctivae clear, pupils appear equal, EOM appear intact ENT: normal nose; moist mucous membranes NECK: Supple, normal ROM CARD: RRR; S1 and S2 appreciated; no murmurs, no clicks, no rubs, no gallops RESP: Normal chest excursion without splinting or tachypnea; breath sounds clear and equal bilaterally; no wheezes, no rhonchi, no rales, no hypoxia or respiratory distress, speaking full sentences ABD/GI: Normal bowel sounds; non-distended; soft, non-tender, no rebound, no guarding, no  peritoneal signs, no hepatosplenomegaly BACK: The back appears normal EXT: Normal ROM in all joints; no deformity noted, no edema; no cyanosis SKIN: Normal color for age and race; warm; no rash on exposed skin NEURO: Moves all extremities equally PSYCH: The patient's mood and manner are appropriate.  ____________________________________________   LABS (all labs ordered are listed, but only abnormal results are displayed)  Labs Reviewed  URINALYSIS, COMPLETE (UACMP) WITH MICROSCOPIC - Abnormal; Notable for the following components:      Result Value   Color, Urine YELLOW (*)    APPearance HAZY (*)    Hgb urine dipstick MODERATE (*)    Ketones, ur 5 (*)    Protein, ur 30 (*)    Leukocytes,Ua TRACE (*)    Bacteria, UA RARE (*)    Non Squamous Epithelial PRESENT (*)    All other components within normal limits  URINE CULTURE  POC URINE PREG, ED   ____________________________________________  EKG   ____________________________________________  RADIOLOGY I, Shaquita Fort, personally viewed and evaluated these images (plain radiographs) as part of my medical decision making, as well as reviewing the written report by the radiologist.  ED MD interpretation:    Official radiology report(s): No results found.  ____________________________________________   PROCEDURES  Procedure(s) performed (including Critical Care):  Procedures   ____________________________________________   INITIAL IMPRESSION / ASSESSMENT AND PLAN / ED COURSE  As part of my medical decision making, I reviewed the following data within the electronic MEDICAL RECORD NUMBER Nursing notes reviewed and incorporated, Labs reviewed , Old chart reviewed and Notes from prior ED visits         Patient here with urinary tract infection.  Urine shows trace leukocytes, 21-50 red blood cells, 21-50 white blood cells, rare bacteria and minimal squamous cells.  Pregnancy test negative.  We will add on urine culture.   No previous urine cultures in care everywhere or in our chart other than 1 that showed mixed flora.  Given allergies to penicillins and cephalosporins, will discharge on Macrobid.  Recommended alternating Tylenol, Motrin, increase fluid intake and will discharge with prescription of Pyridium.  Patient otherwise well-appearing.  No signs of sepsis.  Doubt kidney stone, pyelonephritis.  Abdominal exam benign.  Doubt appendicitis.  No other GU symptoms.  At this time, I do not feel there is any life-threatening condition present. I have reviewed, interpreted and discussed all results (EKG, imaging, lab, urine as appropriate) and exam findings with patient/family. I have reviewed nursing notes and appropriate previous records.  I feel the patient is safe to be discharged home without further emergent workup and can continue workup as an outpatient as needed. Discussed usual and customary return precautions. Patient/family verbalize understanding and are comfortable with this plan.  Outpatient follow-up has been provided as needed. All questions have been answered.    ____________________________________________   FINAL CLINICAL IMPRESSION(S) / ED DIAGNOSES  Final diagnoses:  Lower urinary tract infectious disease  ED Discharge Orders         Ordered    nitrofurantoin, macrocrystal-monohydrate, (MACROBID) 100 MG capsule  2 times daily        06/27/20 2343    phenazopyridine (PYRIDIUM) 95 MG tablet  3 times daily PRN        06/27/20 2343          *Please note:  Novi Calia was evaluated in Emergency Department on 06/28/2020 for the symptoms described in the history of present illness. She was evaluated in the context of the global COVID-19 pandemic, which necessitated consideration that the patient might be at risk for infection with the SARS-CoV-2 virus that causes COVID-19. Institutional protocols and algorithms that pertain to the evaluation of patients at risk for COVID-19 are in a  state of rapid change based on information released by regulatory bodies including the CDC and federal and state organizations. These policies and algorithms were followed during the patient's care in the ED.  Some ED evaluations and interventions may be delayed as a result of limited staffing during and the pandemic.*   Note:  This document was prepared using Dragon voice recognition software and may include unintentional dictation errors.   Neeta Storey, Layla Maw, DO 06/28/20 0011

## 2020-06-30 ENCOUNTER — Emergency Department
Admission: EM | Admit: 2020-06-30 | Discharge: 2020-06-30 | Disposition: A | Discharge status: Home / Self Care | Payer: BC Managed Care – PPO | Attending: Emergency Medicine | Admitting: Emergency Medicine

## 2020-06-30 ENCOUNTER — Emergency Department: Payer: BC Managed Care – PPO

## 2020-06-30 DIAGNOSIS — N390 Urinary tract infection, site not specified: Secondary | ICD-10-CM | POA: Insufficient documentation

## 2020-06-30 DIAGNOSIS — J45909 Unspecified asthma, uncomplicated: Secondary | ICD-10-CM | POA: Insufficient documentation

## 2020-06-30 DIAGNOSIS — Z91010 Allergy to peanuts: Secondary | ICD-10-CM | POA: Diagnosis not present

## 2020-06-30 DIAGNOSIS — R109 Unspecified abdominal pain: Secondary | ICD-10-CM | POA: Diagnosis present

## 2020-06-30 LAB — BASIC METABOLIC PANEL
Anion gap: 9 (ref 5–15)
BUN: 10 mg/dL (ref 6–20)
CO2: 23 mmol/L (ref 22–32)
Calcium: 9.1 mg/dL (ref 8.9–10.3)
Chloride: 106 mmol/L (ref 98–111)
Creatinine, Ser: 0.69 mg/dL (ref 0.44–1.00)
GFR, Estimated: >60 mL/min (ref >60)
Glucose, Bld: 93 mg/dL (ref 70–99)
Potassium: 3.2 mmol/L — ABNORMAL LOW (ref 3.5–5.1)
Sodium: 138 mmol/L (ref 135–145)

## 2020-06-30 LAB — POC URINE PREG, ED: Preg Test, Ur: NEGATIVE

## 2020-06-30 LAB — CBC
HCT: 42.4 % (ref 36.0–46.0)
Hemoglobin: 13.8 g/dL (ref 12.0–15.0)
MCH: 30.6 pg (ref 26.0–34.0)
MCHC: 32.5 g/dL (ref 30.0–36.0)
MCV: 94 fL (ref 80.0–100.0)
Platelets: 299 10*3/uL (ref 150–400)
RBC: 4.51 MIL/uL (ref 3.87–5.11)
RDW: 12.4 % (ref 11.5–15.5)
WBC: 14.5 10*3/uL — ABNORMAL HIGH (ref 4.0–10.5)
nRBC: 0 % (ref 0.0–0.2)

## 2020-06-30 LAB — URINALYSIS, COMPLETE (UACMP) WITH MICROSCOPIC
Bacteria, UA: NONE SEEN
Bilirubin Urine: NEGATIVE
Glucose, UA: NEGATIVE mg/dL
Ketones, ur: NEGATIVE mg/dL
Leukocytes,Ua: NEGATIVE
Nitrite: NEGATIVE
Protein, ur: NEGATIVE mg/dL
Specific Gravity, Urine: 1.009 (ref 1.005–1.030)
WBC, UA: NONE SEEN WBC/hpf (ref 0–5)
pH: 6 (ref 5.0–8.0)

## 2020-06-30 LAB — URINE CULTURE: Culture: 60000 — AB

## 2020-06-30 IMAGING — CT CT RENAL STONE PROTOCOL
2 of 4 series · 17 of 46 positions shown, 19 images · non-contrast
Comparison: None.

CLINICAL DATA: 25-year-old female with flank pain. Concern for
kidney stone.

EXAM:
CT ABDOMEN AND PELVIS WITHOUT CONTRAST
TECHNIQUE: Multidetector CT imaging of the abdomen and pelvis was performed
following the standard protocol without IV contrast.

[Series 2: stone full standard · axial · 0.71mm/px · z∈[-75,+345]mm · 14 of 92 slices shown, 16 images]
[im 4/92  soft-tissue]
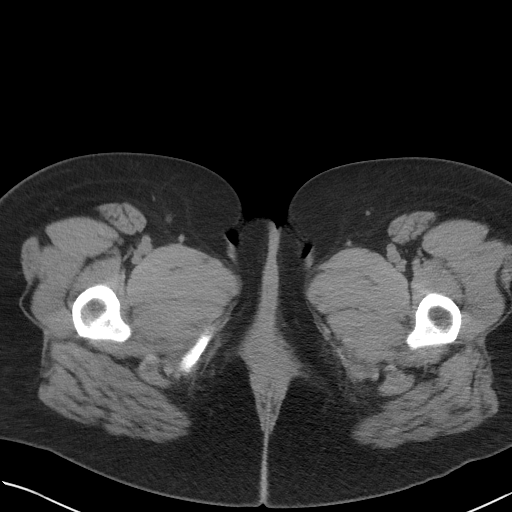
[im 4/92  bone]
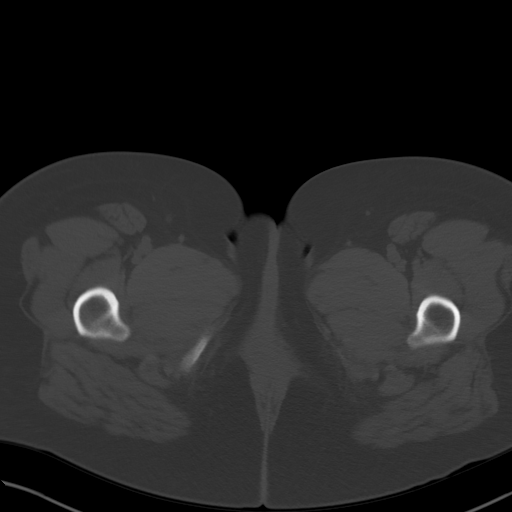
[im 11/92  soft-tissue]
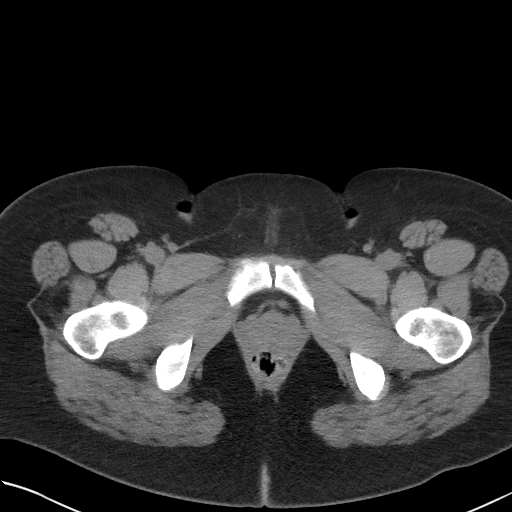
[im 19/92  soft-tissue]
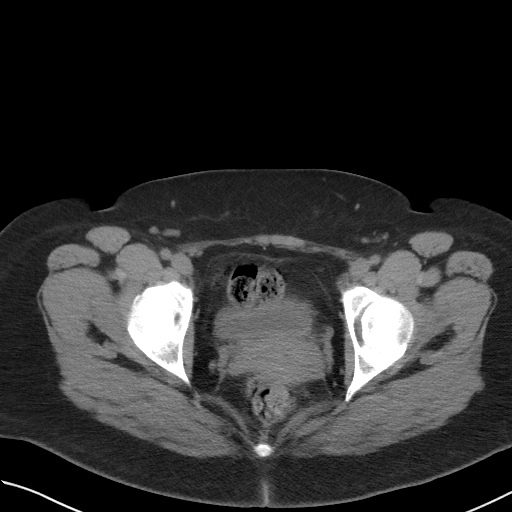
[im 26/92  soft-tissue]
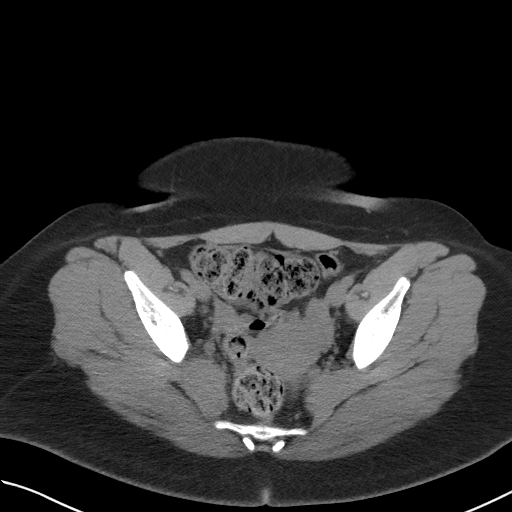
[im 30/92  soft-tissue]
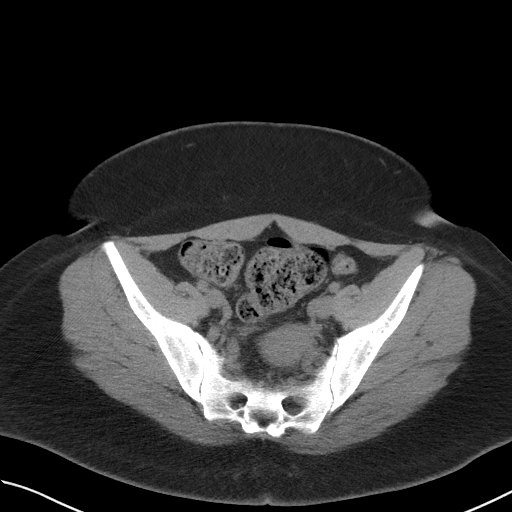
[im 37/92  soft-tissue]
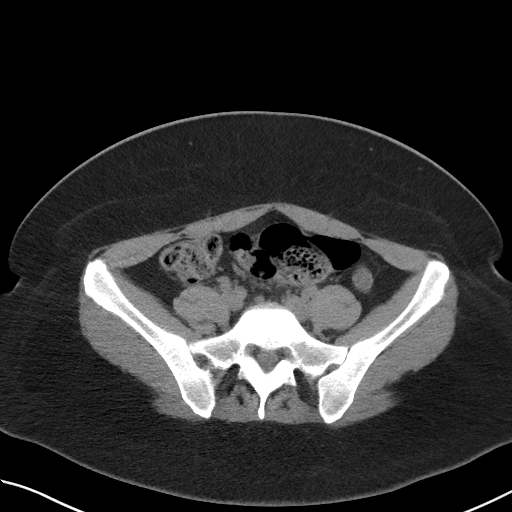
[im 44/92  soft-tissue]
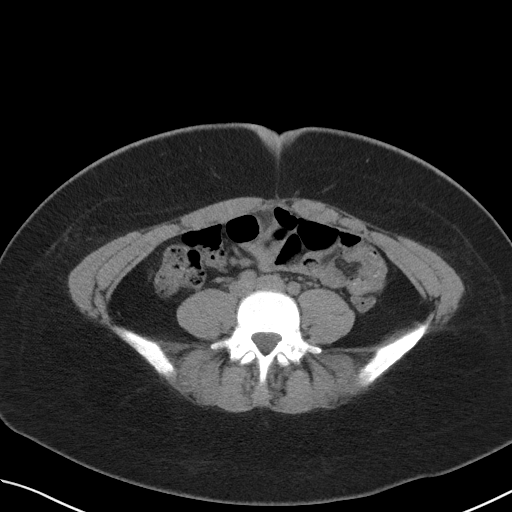
[im 48/92  soft-tissue]
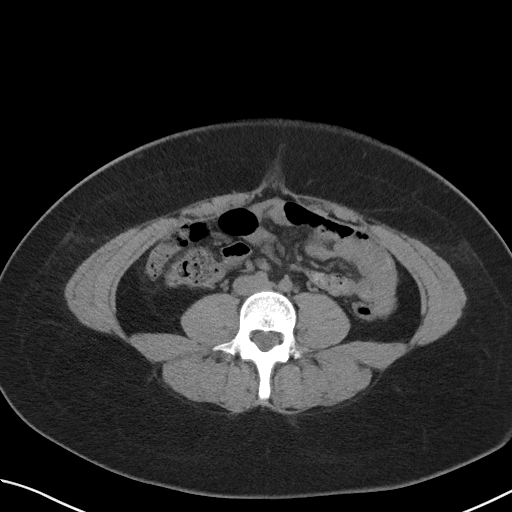
[im 55/92  soft-tissue]
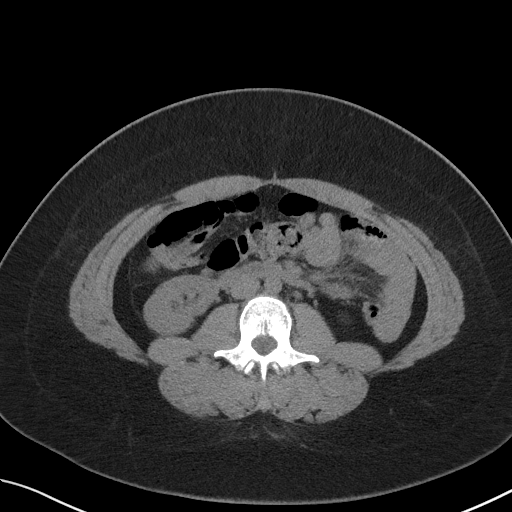
[im 55/92  bone]
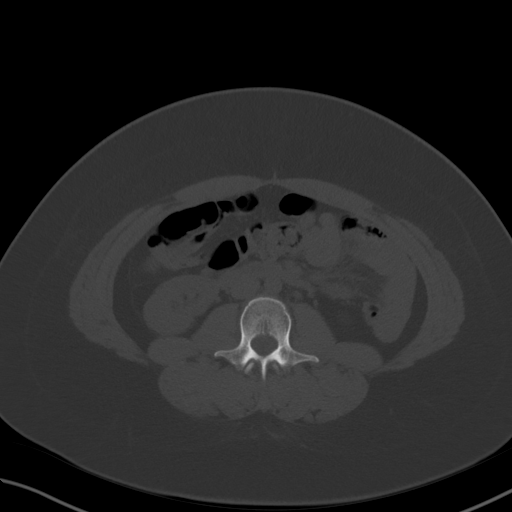
[im 62/92  soft-tissue]
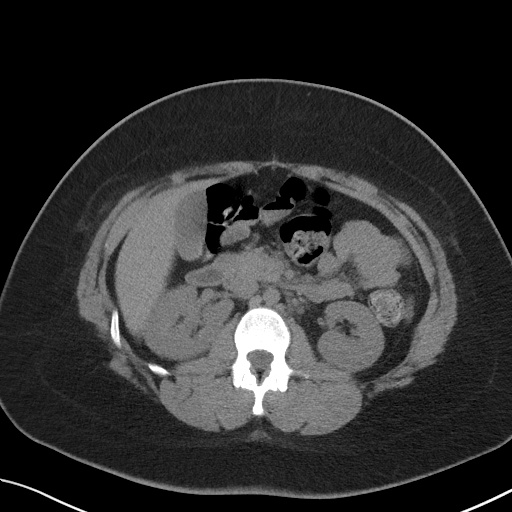
[im 70/92  soft-tissue]
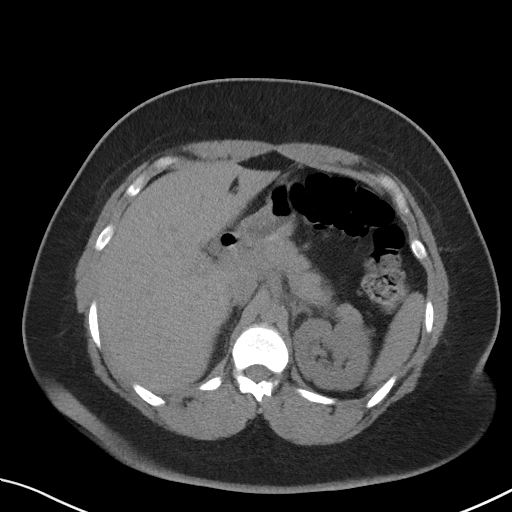
[im 73/92  soft-tissue]
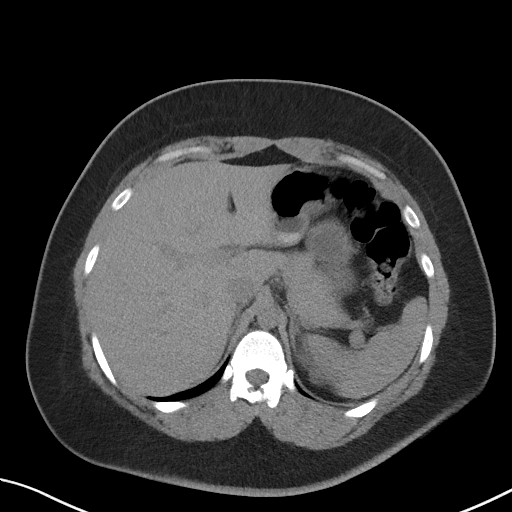
[im 81/92  soft-tissue]
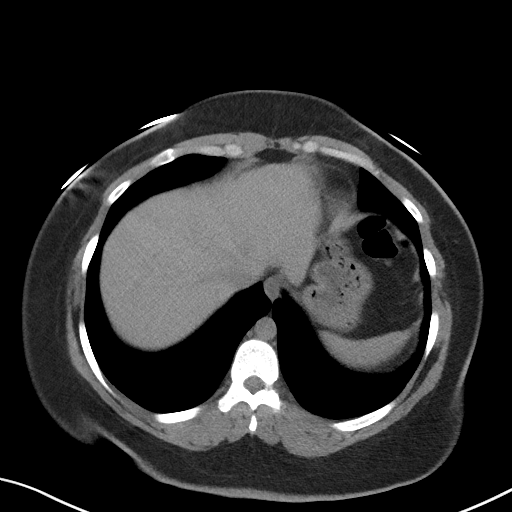
[im 88/92  soft-tissue]
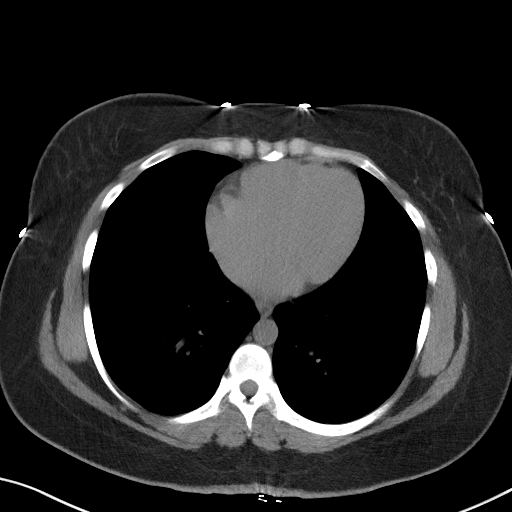

[Series 5: coronal · coronal · 0.79mm/px · 3 of 155 slices shown]
[im 52/155  soft-tissue]
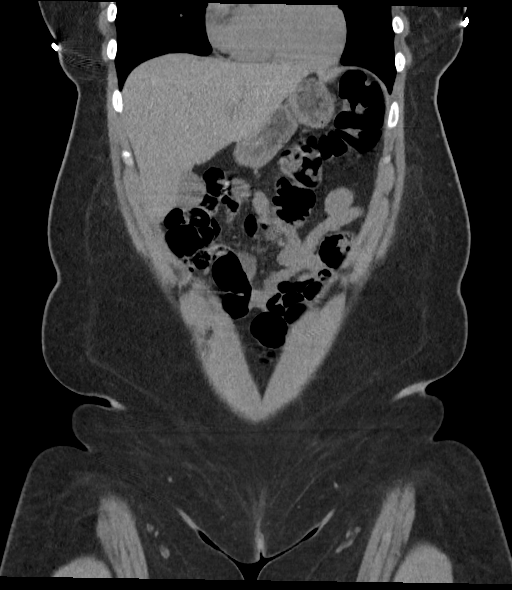
[im 69/155  soft-tissue]
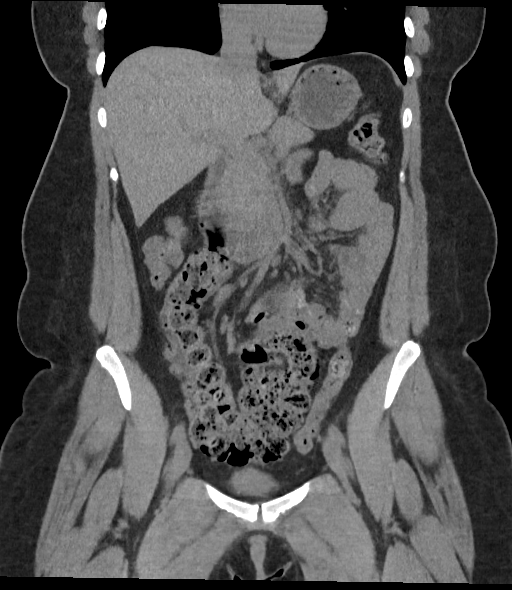
[im 86/155  soft-tissue]
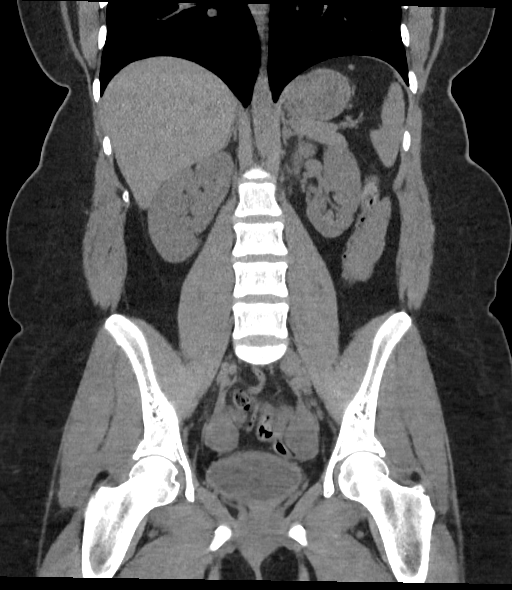

[17 of 46 positions shown; findings below may reference images not displayed]

FINDINGS: Evaluation of this exam is limited in the absence of intravenous
contrast.

Lower chest: The visualized lung bases are clear.

No intra-abdominal free air or free fluid.

Hepatobiliary: No focal liver abnormality is seen. No gallstones,
gallbladder wall thickening, or biliary dilatation.

Pancreas: Unremarkable. No pancreatic ductal dilatation or
surrounding inflammatory changes.

Spleen: Normal in size without focal abnormality.

Adrenals/Urinary Tract: The adrenal glands unremarkable. The
kidneys, visualized ureters, and urinary bladder appear
unremarkable.

Stomach/Bowel: There is no bowel obstruction or active inflammation.
The appendix is normal.

Vascular/Lymphatic: The abdominal aorta and IVC unremarkable. No
portal venous gas. There is no adenopathy.

Reproductive: The uterus and ovaries are grossly unremarkable.

Other: None

Musculoskeletal: No acute or significant osseous findings.
IMPRESSION: No acute intra-abdominal or pelvic pathology. No hydronephrosis or
nephrolithiasis.

## 2020-06-30 MED ORDER — SULFAMETHOXAZOLE-TRIMETHOPRIM 800-160 MG PO TABS: 1.0000 | ORAL_TABLET | Freq: Two times a day (BID) | ORAL | 0 refills | Status: DC

## 2020-06-30 MED ORDER — SULFAMETHOXAZOLE-TRIMETHOPRIM 800-160 MG PO TABS
1.0000 | ORAL_TABLET | Freq: Once | ORAL | Status: AC
  Administered 2020-06-30: 1 via ORAL
  Filled 2020-06-30: qty 1

## 2020-06-30 NOTE — ED Provider Notes (Signed)
[facility] Emergency Department Provider Note  ____________________________________________   Event Date/Time   First MD Initiated Contact with Patient 06/30/20 2033     (approximate)  I have reviewed the triage vital signs and the nursing notes.   HISTORY  Chief Complaint Dysuria and Flank Pain  HPI Chelsea Underwood is a 25 y.o. female returns to the ED for subsequent evaluation of ongoing and worsening flank pain and dysuria.  Patient was evaluated 3 days prior, for presumed UTI.  Urinalysis was performed that time, which did recognize some moderate white blood cell and red blood cells.  She was started empirically on nitrofurantoin secondary to multiple antibiotic allergies.  A urine culture performed at that time, reports back today showing Klebsiella as the primary bacteria.  C&S shows susceptibility to both nitrofurantoin and Bactrim.  Patient reports increased right greater than left flank discomfort but does note a decrease in her dysuria and urinary frequency.  She has noted some chills and nausea, but denies any frank fevers or vomiting.  Past Medical History:  Diagnosis Date  . Asthma     There are no problems to display for this patient.   History reviewed. No pertinent surgical history.  Prior to Admission medications   Medication Sig Start Date End Date Taking? Authorizing Provider  sulfamethoxazole-trimethoprim (BACTRIM DS) 800-160 MG tablet Take 1 tablet by mouth 2 (two) times daily. 06/30/20  Yes Tyqwan Pink, Charlesetta Ivory, PA-C  ondansetron (ZOFRAN) 4 MG tablet Take 1 tablet (4 mg total) by mouth every 6 (six) hours. 01/23/19   Eustace Moore, MD  phenazopyridine (PYRIDIUM) 95 MG tablet Take 1 tablet (95 mg total) by mouth 3 (three) times daily as needed for pain. 06/27/20   Ward, Layla Maw, DO    Allergies Amoxicillin, Cefdinir, Other, Peanut butter flavor, Peanut oil, Penicillin v, Apple, Peanut-containing drug products, and  Penicillins  History reviewed. No pertinent family history.  Social History Social History   Tobacco Use  . Smoking status: Never Smoker  . Smokeless tobacco: Never Used  Substance Use Topics  . Alcohol use: Yes    Review of Systems  Constitutional: No fever/chills Eyes: No visual changes. ENT: No sore throat. Cardiovascular: Denies chest pain. Respiratory: Denies shortness of breath. Gastrointestinal: No abdominal pain.  No nausea, no vomiting.  No diarrhea.  No constipation.  Reports flank pain as above.   Genitourinary: Negative for dysuria. Musculoskeletal: Negative for back pain. Skin: Negative for rash. Neurological: Negative for headaches, focal weakness or numbness. ____________________________________________   PHYSICAL EXAM:  VITAL SIGNS: ED Triage Vitals  Enc Vitals Group     BP 06/30/20 1949 129/72     Pulse Rate 06/30/20 1949 (!) 103     Resp 06/30/20 1949 20     Temp 06/30/20 1949 98.9 F (37.2 C)     Temp Source 06/30/20 1949 Oral     SpO2 06/30/20 1949 99 %     Weight 06/30/20 1952 202 lb 13.2 oz (92 kg)     Height 06/30/20 1952 5\' 3"  (1.6 m)     Head Circumference --      Peak Flow --      Pain Score 06/30/20 1952 8     Pain Loc --      Pain Edu? --      Excl. in GC? --     Constitutional: Alert and oriented. Well appearing and in no acute distress. Eyes: Conjunctivae are normal. PERRL. EOMI. Head: Atraumatic. Nose: No  congestion/rhinnorhea. Mouth/Throat: Mucous membranes are moist.  Oropharynx non-erythematous. Neck: No stridor.   Cardiovascular: Normal rate, regular rhythm. Grossly normal heart sounds.  Good peripheral circulation. Respiratory: Normal respiratory effort.  No retractions. Lungs CTAB. Gastrointestinal: Soft and nontender. No distention. No abdominal bruits.  Right greater than left CVA tenderness. Musculoskeletal: No lower extremity tenderness nor edema.  No joint effusions. Neurologic:  Normal speech and language. No  gross focal neurologic deficits are appreciated. No gait instability. Skin:  Skin is warm, dry and intact. No rash noted. Psychiatric: Mood and affect are normal. Speech and behavior are normal.  ____________________________________________   LABS (all labs ordered are listed, but only abnormal results are displayed)  Labs Reviewed  URINALYSIS, COMPLETE (UACMP) WITH MICROSCOPIC - Abnormal; Notable for the following components:      Result Value   Color, Urine STRAW (*)    APPearance CLEAR (*)    Hgb urine dipstick SMALL (*)    All other components within normal limits  CBC - Abnormal; Notable for the following components:   WBC 14.5 (*)    All other components within normal limits  BASIC METABOLIC PANEL - Abnormal; Notable for the following components:   Potassium 3.2 (*)    All other components within normal limits  POC URINE PREG, ED   ____________________________________________  EKG   ____________________________________________  RADIOLOGY I, Lissa Hoard, personally viewed and evaluated these images (plain radiographs) as part of my medical decision making, as well as reviewing the written report by the radiologist.  ED MD interpretation:  Agree with report  Official radiology report(s): CT Renal Stone Study  Result Date: 06/30/2020 CLINICAL DATA:  25 year old female with flank pain. Concern for kidney stone. EXAM: CT ABDOMEN AND PELVIS WITHOUT CONTRAST TECHNIQUE: Multidetector CT imaging of the abdomen and pelvis was performed following the standard protocol without IV contrast. COMPARISON:  None. FINDINGS: Evaluation of this exam is limited in the absence of intravenous contrast. Lower chest: The visualized lung bases are clear. No intra-abdominal free air or free fluid. Hepatobiliary: No focal liver abnormality is seen. No gallstones, gallbladder wall thickening, or biliary dilatation. Pancreas: Unremarkable. No pancreatic ductal dilatation or surrounding  inflammatory changes. Spleen: Normal in size without focal abnormality. Adrenals/Urinary Tract: The adrenal glands unremarkable. The kidneys, visualized ureters, and urinary bladder appear unremarkable. Stomach/Bowel: There is no bowel obstruction or active inflammation. The appendix is normal. Vascular/Lymphatic: The abdominal aorta and IVC unremarkable. No portal venous gas. There is no adenopathy. Reproductive: The uterus and ovaries are grossly unremarkable. Other: None Musculoskeletal: No acute or significant osseous findings. IMPRESSION: No acute intra-abdominal or pelvic pathology. No hydronephrosis or nephrolithiasis. Electronically Signed   By: Elgie Collard M.D.   On: 06/30/2020 21:25    ____________________________________________   PROCEDURES  Procedure(s) performed (including Critical Care):  Procedures   Bactrim DS 1 PO  ____________________________________________   INITIAL IMPRESSION / ASSESSMENT AND PLAN / ED COURSE  As part of my medical decision making, I reviewed the following data within the electronic MEDICAL RECORD NUMBER Labs reviewed elevated WBCs and Notes from prior ED visits    Differential diagnosis includes, but is not limited to, ovarian cyst, ovarian torsion, acute appendicitis, diverticulitis, urinary tract infection/pyelonephritis, endometriosis, bowel obstruction, colitis, renal colic, gastroenteritis, hernia, fibroids, endometriosis, pregnancy related pain including ectopic pregnancy, etc.  Patient with subsequent ED visit for ongoing dysuria and persistent flank pain.  She was evaluated for her symptoms, and found to have a complicated UTI with now symptoms of  flank pain.  Urinalysis is improved from 3 days prior.  Her interim urine culture confirmed susceptibility to nitrofurantoin, but we decided to switch antibiotic to Bactrim at this time.  Patient will continue to monitor symptoms and return to the ED if necessary.  Strict return precautions have been  reviewed. ____________________________________________   FINAL CLINICAL IMPRESSION(S) / ED DIAGNOSES  Final diagnoses:  Lower urinary tract infectious disease     ED Discharge Orders         Ordered    sulfamethoxazole-trimethoprim (BACTRIM DS) 800-160 MG tablet  2 times daily        06/30/20 2136          *Please note:  Chelsea Underwood was evaluated in Emergency Department on 06/30/2020 for the symptoms described in the history of present illness. She was evaluated in the context of the global COVID-19 pandemic, which necessitated consideration that the patient might be at risk for infection with the SARS-CoV-2 virus that causes COVID-19. Institutional protocols and algorithms that pertain to the evaluation of patients at risk for COVID-19 are in a state of rapid change based on information released by regulatory bodies including the CDC and federal and state organizations. These policies and algorithms were followed during the patient's care in the ED.  Some ED evaluations and interventions may be delayed as a result of limited staffing during and the pandemic.*   Note:  This document was prepared using Dragon voice recognition software and may include unintentional dictation errors.    Lissa Hoard, PA-C 06/30/20 2141    Shaune Pollack, MD 07/05/20 [phone]

## 2020-06-30 NOTE — ED Triage Notes (Signed)
Pt seen here 5/5 and diagnosed with UTI, started on macrobid and taking as prescribed, states "it's not getting any better, the pain is getting worse." Pt reports right flank pain

## 2020-06-30 NOTE — ED Notes (Signed)
Pt taken to CT at this time.

## 2020-06-30 NOTE — Discharge Instructions (Addendum)
Take the new antibiotic as directed.  Continue to monitor your symptoms and return to the ED for any worsening of your symptoms.

## 2020-10-11 ENCOUNTER — Emergency Department: Payer: BC Managed Care – PPO

## 2020-10-11 ENCOUNTER — Other Ambulatory Visit: Payer: Self-pay

## 2020-10-11 ENCOUNTER — Emergency Department
Admission: EM | Admit: 2020-10-11 | Discharge: 2020-10-11 | Disposition: A | Discharge status: Home / Self Care | Payer: BC Managed Care – PPO | Attending: Emergency Medicine | Admitting: Emergency Medicine

## 2020-10-11 DIAGNOSIS — O208 Other hemorrhage in early pregnancy: Secondary | ICD-10-CM | POA: Diagnosis present

## 2020-10-11 DIAGNOSIS — O99511 Diseases of the respiratory system complicating pregnancy, first trimester: Secondary | ICD-10-CM | POA: Diagnosis not present

## 2020-10-11 DIAGNOSIS — J45909 Unspecified asthma, uncomplicated: Secondary | ICD-10-CM | POA: Insufficient documentation

## 2020-10-11 DIAGNOSIS — B9689 Other specified bacterial agents as the cause of diseases classified elsewhere: Secondary | ICD-10-CM | POA: Diagnosis not present

## 2020-10-11 DIAGNOSIS — Z3A01 Less than 8 weeks gestation of pregnancy: Secondary | ICD-10-CM | POA: Diagnosis not present

## 2020-10-11 DIAGNOSIS — O2391 Unspecified genitourinary tract infection in pregnancy, first trimester: Secondary | ICD-10-CM | POA: Diagnosis not present

## 2020-10-11 DIAGNOSIS — N939 Abnormal uterine and vaginal bleeding, unspecified: Secondary | ICD-10-CM

## 2020-10-11 DIAGNOSIS — R8271 Bacteriuria: Secondary | ICD-10-CM

## 2020-10-11 DIAGNOSIS — Z91010 Allergy to peanuts: Secondary | ICD-10-CM | POA: Insufficient documentation

## 2020-10-11 DIAGNOSIS — O209 Hemorrhage in early pregnancy, unspecified: Secondary | ICD-10-CM

## 2020-10-11 DIAGNOSIS — R102 Pelvic and perineal pain: Secondary | ICD-10-CM | POA: Diagnosis not present

## 2020-10-11 LAB — URINALYSIS, COMPLETE (UACMP) WITH MICROSCOPIC
Bilirubin Urine: NEGATIVE
Glucose, UA: NEGATIVE mg/dL
Hgb urine dipstick: NEGATIVE
Ketones, ur: 5 mg/dL — AB
Leukocytes,Ua: NEGATIVE
Nitrite: NEGATIVE
Protein, ur: NEGATIVE mg/dL
Specific Gravity, Urine: 1.023 (ref 1.005–1.030)
pH: 6 (ref 5.0–8.0)

## 2020-10-11 LAB — CBC WITH DIFFERENTIAL/PLATELET
Abs Immature Granulocytes: 0.04 10*3/uL (ref 0.00–0.07)
Basophils Absolute: 0 10*3/uL (ref 0.0–0.1)
Basophils Relative: 0 %
Eosinophils Absolute: 0.1 10*3/uL (ref 0.0–0.5)
Eosinophils Relative: 1 %
HCT: 41.7 % (ref 36.0–46.0)
Hemoglobin: 14.1 g/dL (ref 12.0–15.0)
Immature Granulocytes: 0 %
Lymphocytes Relative: 28 %
Lymphs Abs: 2.8 10*3/uL (ref 0.7–4.0)
MCH: 31.5 pg (ref 26.0–34.0)
MCHC: 33.8 g/dL (ref 30.0–36.0)
MCV: 93.1 fL (ref 80.0–100.0)
Monocytes Absolute: 0.6 10*3/uL (ref 0.1–1.0)
Monocytes Relative: 6 %
Neutro Abs: 6.4 10*3/uL (ref 1.7–7.7)
Neutrophils Relative %: 65 %
Platelets: 310 10*3/uL (ref 150–400)
RBC: 4.48 MIL/uL (ref 3.87–5.11)
RDW: 12.7 % (ref 11.5–15.5)
WBC: 10 10*3/uL (ref 4.0–10.5)
nRBC: 0 % (ref 0.0–0.2)

## 2020-10-11 LAB — HCG, QUANTITATIVE, PREGNANCY: hCG, Beta Chain, Quant, S: 4746 m[IU]/mL — ABNORMAL HIGH (ref <5)

## 2020-10-11 LAB — ABO/RH: ABO/RH(D): O POS

## 2020-10-11 LAB — POC URINE PREG, ED: Preg Test, Ur: POSITIVE — AB

## 2020-10-11 IMAGING — US US OB < 14 WEEKS - US OB TV
1 series · 14 of 28 positions shown · non-contrast
Comparison: None.

CLINICAL DATA: Vaginal bleeding.

EXAM:
OBSTETRIC <14 WK US AND TRANSVAGINAL OB US
TECHNIQUE: Both transabdominal and transvaginal ultrasound examinations were
performed for complete evaluation of the gestation as well as the
maternal uterus, adnexal regions, and pelvic cul-de-sac.
Transvaginal technique was performed to assess early pregnancy.

[Series 1: us ob less than 14 weeks with ob transvaginal · 14 of 107 slices shown]
[im 4/107]
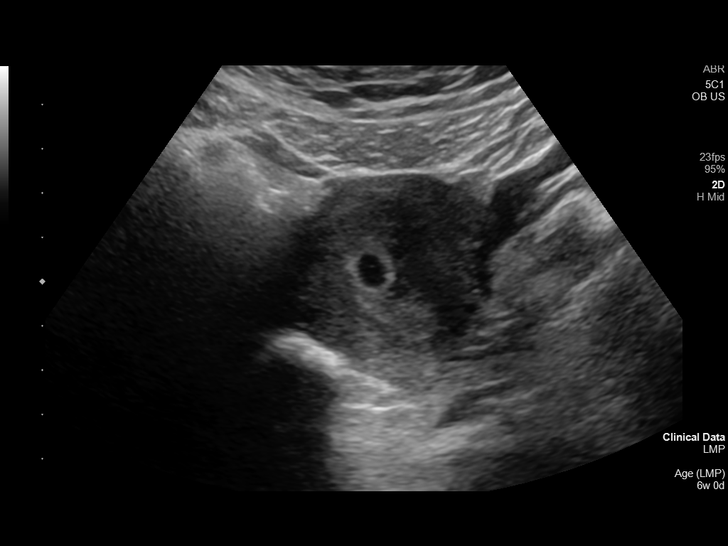
[im 12/107]
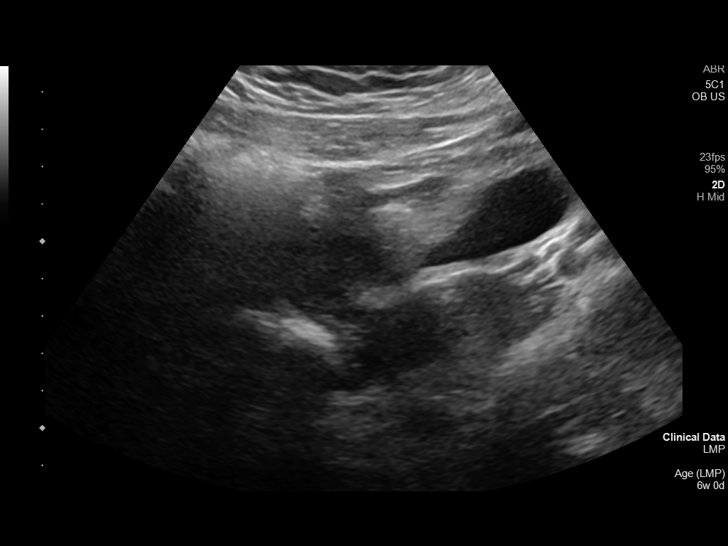
[im 20/107]
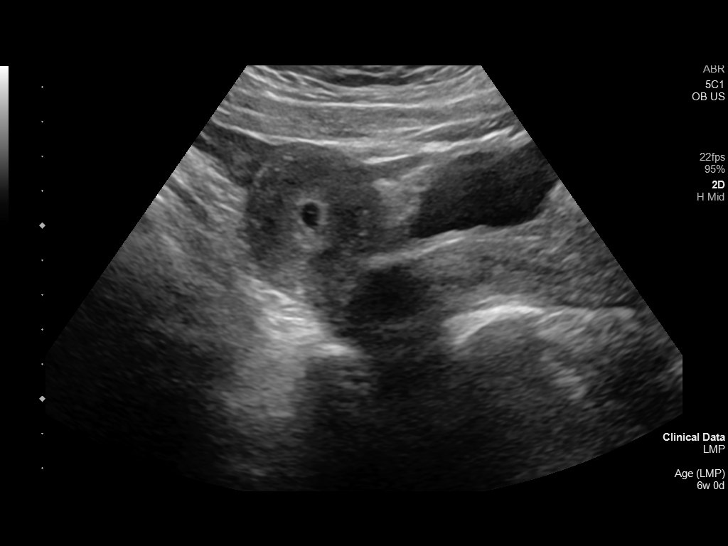
[im 28/107]
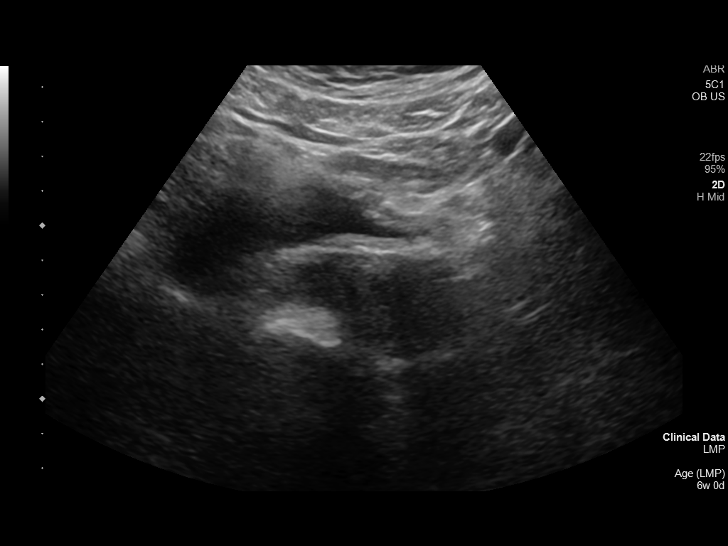
[im 36/107]
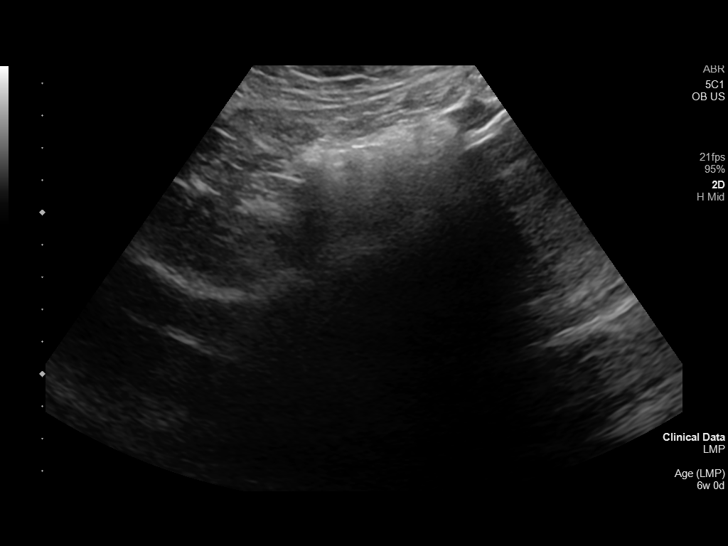
[im 44/107]
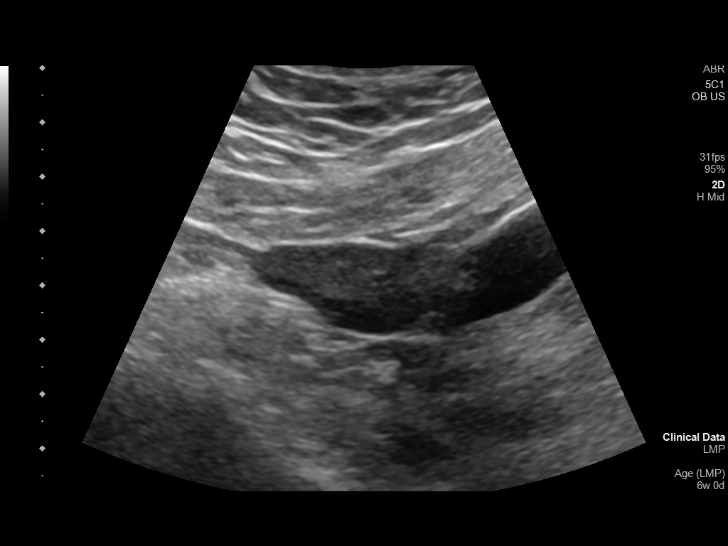
[im 52/107]
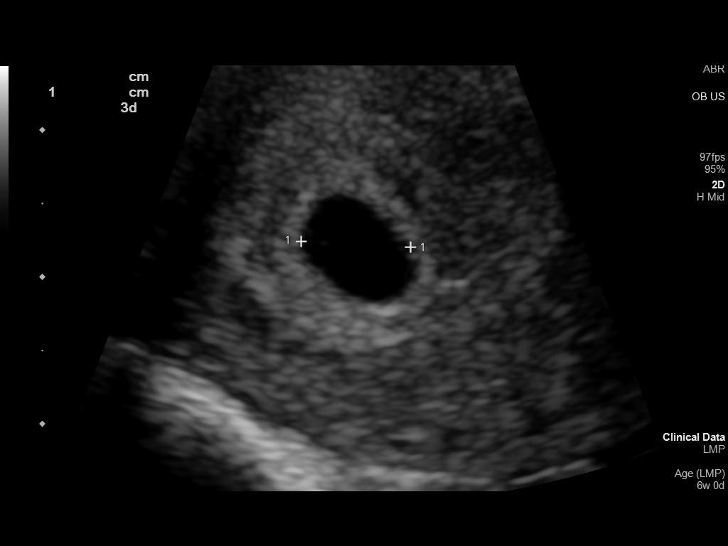
[im 59/107]
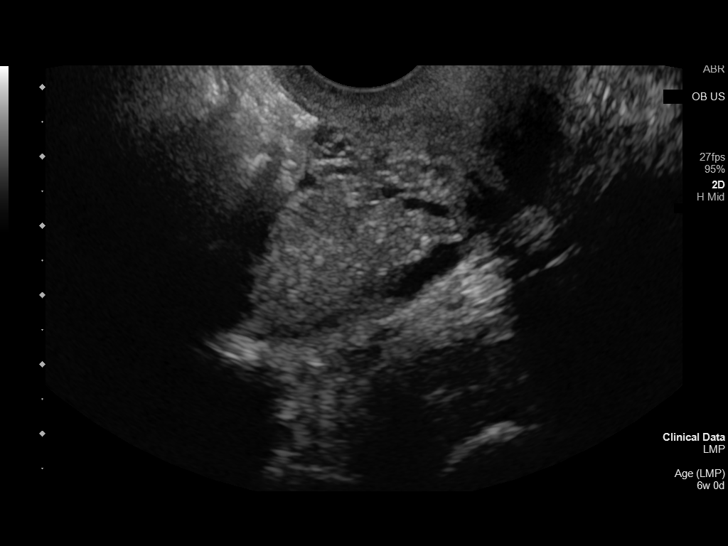
[im 67/107]
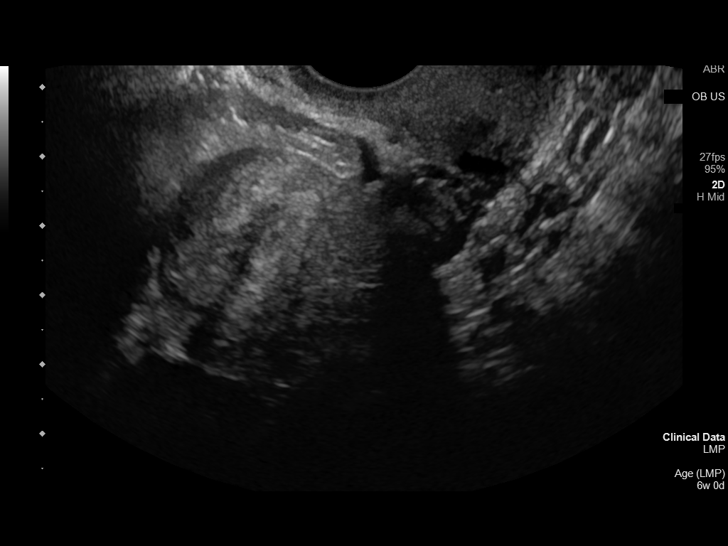
[im 75/107]
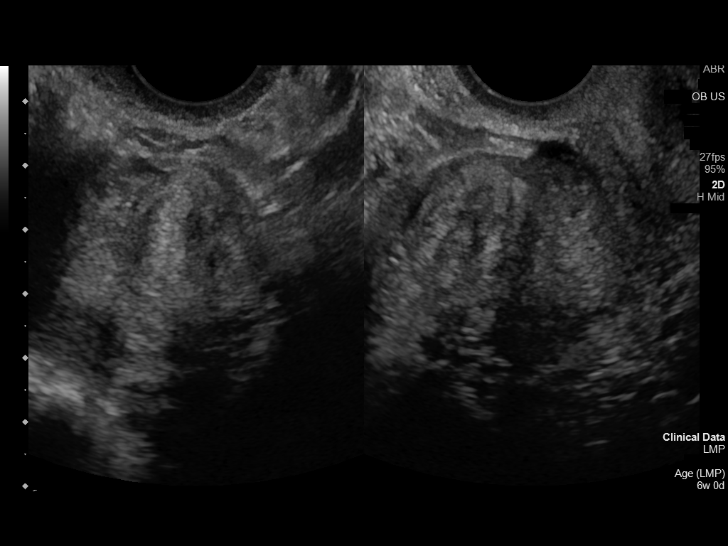
[im 83/107]
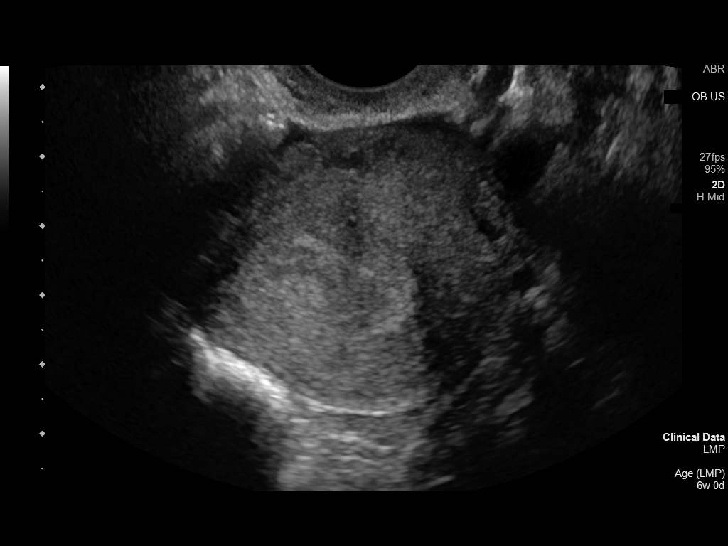
[im 91/107]
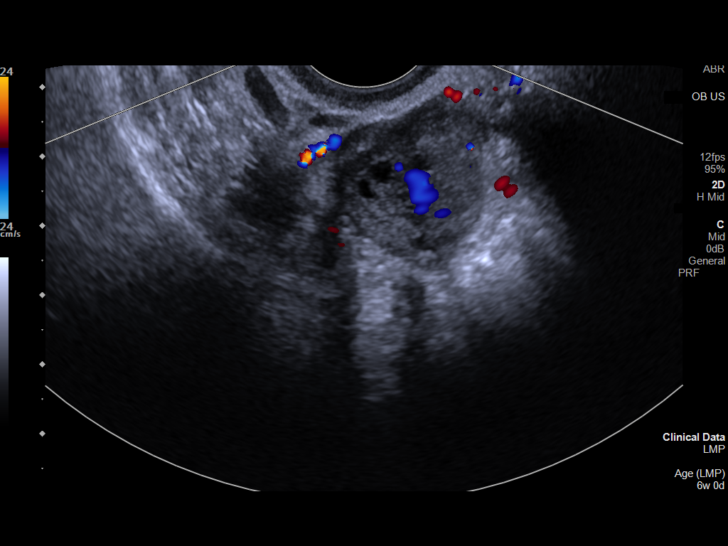
[im 99/107]
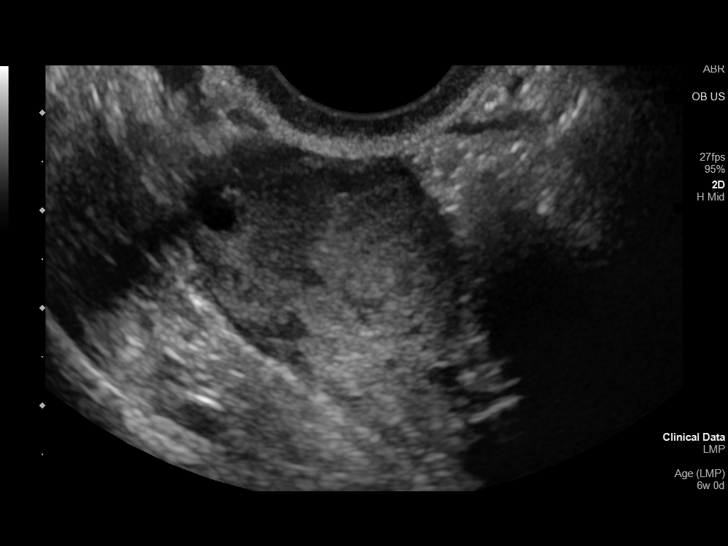
[im 107/107]
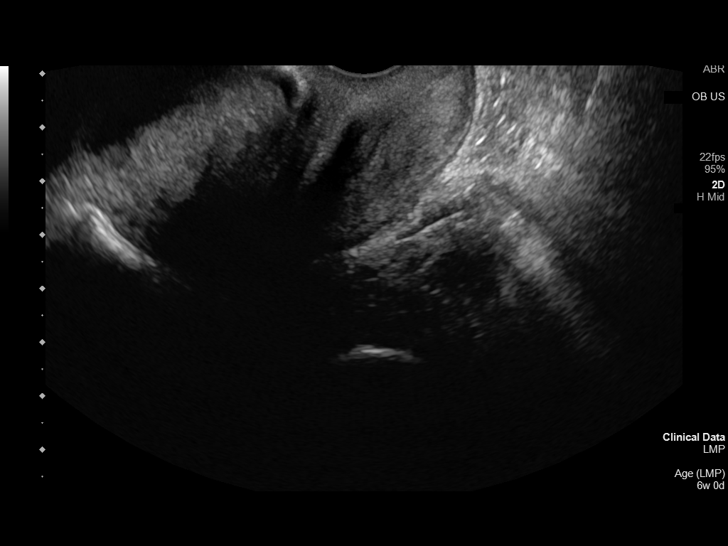

[14 of 28 positions shown; findings below may reference images not displayed]

FINDINGS: Intrauterine gestational sac: Single

Yolk sac:  Visualized.

Embryo:  Not Visualized.

Cardiac Activity: Not Visualized.

MSD: 6 mm   5 w   3 d

Subchorionic hemorrhage:  None visualized.

Maternal uterus/adnexae: Ovaries are unremarkable. Uterine fibroid
is noted. No free fluid is noted.
IMPRESSION: Probable early intrauterine gestational sac with yolk sac, but no
fetal pole or cardiac activity yet visualized. Recommend follow-up
quantitative B-HCG levels and follow-up US in 14 days to assess
viability. This recommendation follows SRU consensus guidelines:
Diagnostic Criteria for Nonviable Pregnancy Early in the First
Trimester. N Engl J Med 2601; [DATE].

## 2020-10-11 MED ORDER — ACETAMINOPHEN 500 MG PO TABS: 1000.0000 mg | ORAL_TABLET | Freq: Once | ORAL | Status: DC

## 2020-10-11 MED ORDER — FOSFOMYCIN TROMETHAMINE 3 G PO PACK: 3.0000 g | PACK | ORAL | 0 refills | Status: AC

## 2020-10-11 NOTE — ED Provider Notes (Signed)
[facility] Emergency Department Provider Note  ____________________________________________   Event Date/Time   First MD Initiated Contact with Patient 10/11/20 1105     (approximate)  I have reviewed the triage vital signs and the nursing notes.   HISTORY  Chief Complaint Vaginal Bleeding   HPI Chelsea Underwood is a 25 y.o. female G2, P0010 approximately 6 weeks by last LMP I have not had a chance to see OB yet for this pregnancy who presents for assessment of some lower crampy abdominal discomfort associate with vaginal bleeding that she states began around 11 PM last night.  She states she think she is gone through maybe 1 pad since it began and thinks it may have slowed down a little bit.  She denies any preceding trauma or injuries.  She otherwise has been doing well without any fevers, chills, cough, shortness of breath, headache, earache, sore throat, diarrhea, burning with urination, other recent abnormal vaginal bleeding or discharge or any other acute concerns at this time.         Past Medical History:  Diagnosis Date   Asthma     There are no problems to display for this patient.   History reviewed. No pertinent surgical history.  Prior to Admission medications   Medication Sig Start Date End Date Taking? Authorizing Provider  fosfomycin (MONUROL) 3 g PACK Take 3 g by mouth every other day for 3 doses. 10/11/20 10/16/20 Yes Gilles Chiquito, MD    Allergies Amoxicillin, Cefdinir, Other, Peanut butter flavor, Peanut oil, Penicillin v, Apple, Peanut-containing drug products, and Penicillins  No family history on file.  Social History Social History   Tobacco Use   Smoking status: Never   Smokeless tobacco: Never  Substance Use Topics   Alcohol use: Yes    Review of Systems  Review of Systems  Constitutional:  Negative for chills and fever.  HENT:  Negative for sore throat.   Eyes:  Negative for pain.  Respiratory:   Negative for cough and stridor.   Cardiovascular:  Negative for chest pain.  Gastrointestinal:  Positive for abdominal pain. Negative for vomiting.  Genitourinary:  Negative for dysuria.  Musculoskeletal:  Negative for myalgias.  Skin:  Negative for rash.  Neurological:  Negative for seizures, loss of consciousness and headaches.  Psychiatric/Behavioral:  Negative for suicidal ideas.   All other systems reviewed and are negative.    ____________________________________________   PHYSICAL EXAM:  VITAL SIGNS: ED Triage Vitals  Enc Vitals Group     BP 10/11/20 0909 115/82     Pulse Rate 10/11/20 0909 (!) 103     Resp 10/11/20 0909 18     Temp 10/11/20 0909 98.9 F (37.2 C)     Temp Source 10/11/20 0909 Oral     SpO2 10/11/20 0909 99 %     Weight 10/11/20 0909 199 lb 9.6 oz (90.5 kg)     Height 10/11/20 0909 5' 3.5" (1.613 m)     Head Circumference --      Peak Flow --      Pain Score 10/11/20 0854 5     Pain Loc --      Pain Edu? --      Excl. in GC? --    Vitals:   10/11/20 0909  BP: 115/82  Pulse: (!) 103  Resp: 18  Temp: 98.9 F (37.2 C)  SpO2: 99%   Physical Exam Vitals and nursing note reviewed.  Constitutional:  General: She is not in acute distress.    Appearance: She is well-developed.  HENT:     Head: Normocephalic and atraumatic.     Right Ear: External ear normal.     Left Ear: External ear normal.     Nose: Nose normal.  Eyes:     Conjunctiva/sclera: Conjunctivae normal.  Cardiovascular:     Rate and Rhythm: Normal rate and regular rhythm.     Heart sounds: No murmur heard. Pulmonary:     Effort: Pulmonary effort is normal. No respiratory distress.  Abdominal:     Palpations: Abdomen is soft.     Tenderness: There is no abdominal tenderness. There is no right CVA tenderness or left CVA tenderness.  Musculoskeletal:     Cervical back: Neck supple.  Skin:    General: Skin is warm and dry.     Capillary Refill: Capillary refill takes less  than 2 seconds.  Neurological:     Mental Status: She is alert and oriented to person, place, and time.  Psychiatric:        Mood and Affect: Mood normal.     ____________________________________________   LABS (all labs ordered are listed, but only abnormal results are displayed)  Labs Reviewed  HCG, QUANTITATIVE, PREGNANCY - Abnormal; Notable for the following components:      Result Value   hCG, Beta Chain, Quant, S 4,746 (*)    All other components within normal limits  URINALYSIS, COMPLETE (UACMP) WITH MICROSCOPIC - Abnormal; Notable for the following components:   Color, Urine YELLOW (*)    APPearance CLEAR (*)    Ketones, ur 5 (*)    Bacteria, UA RARE (*)    All other components within normal limits  POC URINE PREG, ED - Abnormal; Notable for the following components:   Preg Test, Ur POSITIVE (*)    All other components within normal limits  URINE CULTURE  CBC WITH DIFFERENTIAL/PLATELET  ABO/RH   ____________________________________________  EKG  ____________________________________________  RADIOLOGY  ED MD interpretation: Pelvic ultrasound shows probable early gestational sac but no fetal pole or cardiac cavity noted.  There is no free fluid.  Ovaries are unremarkable bilaterally.  There is an isolated fibroid noted.  No other acute abnormalities noted.  Official radiology report(s): US OB LESS THAN 14 WEEKS WITH OB TRANSVAGINAL  Result Date: 10/11/2020 CLINICAL DATA:  Vaginal bleeding. EXAM: OBSTETRIC <14 WK [place] AND TRANSVAGINAL OB US TECHNIQUE: Both transabdominal and transvaginal ultrasound examinations were performed for complete evaluation of the gestation as well as the maternal uterus, adnexal regions, and pelvic cul-de-sac. Transvaginal technique was performed to assess early pregnancy. COMPARISON:  None. FINDINGS: Intrauterine gestational sac: Single Yolk sac:  Visualized. Embryo:  Not Visualized. Cardiac Activity: Not Visualized. MSD: 6 mm   5 w   3 d  Subchorionic hemorrhage:  None visualized. Maternal uterus/adnexae: Ovaries are unremarkable. Uterine fibroid is noted. No free fluid is noted. IMPRESSION: Probable early intrauterine gestational sac with yolk sac, but no fetal pole or cardiac activity yet visualized. Recommend follow-up quantitative B-HCG levels and follow-up US in 14 days to assess viability. This recommendation follows SRU consensus guidelines: Diagnostic Criteria for Nonviable Pregnancy Early in the First Trimester. Malva Limes Med 2013; 782:9562-13. Electronically Signed   By: Lupita Raider M.D.   On: 10/11/2020 12:26    ____________________________________________   PROCEDURES  Procedure(s) performed (including Critical Care):  Procedures   ____________________________________________   INITIAL IMPRESSION / ASSESSMENT AND PLAN / ED COURSE  Patient presents with above to history exam for assessment of some vaginal bleeding in the setting of being approximately [redacted] weeks pregnant.  On arrival she is slightly tachycardic at 103 with otherwise stable vital signs on room air.  Differential includes possible early or threatened miscarriage, subchorionic hemorrhage, and ectopic pregnancy.  Patient denies any other abnormal discharge or symptoms to suggest acute infectious process or preceding trauma.  Pelvic ultrasound shows probable early gestational sac but no fetal pole or cardiac cavity noted.  There is no free fluid.  Ovaries are unremarkable bilaterally.  There is an isolated fibroid noted.  No other acute abnormalities noted.  CBC shows no leukocytosis and hemoglobin of 14 without any indication for blood transfusion.  Edition patient denies any symptoms to suggest acute anemia.  Patient is Rh+ is her no indication for RhoGAM at this time.  hCG is 4746.  UA has some rare bacteria but no other evidence of infection.  However we will treat this asymptomatic bacteriuria.  Patient has fairly significant allergies noted in  her chart and confirmed on interview.  We will give short course of fosfomycin.  Advise close outpatient OB follow-up where they can arrange for repeat ultrasound hCG.  Advised patient may take Tylenol every 6 hours as needed.  Advise she should return immediately to the emergency room for any acute worsening symptoms.  Discharged stable condition.  Strict return precautions advised and discussed.     ____________________________________________   FINAL CLINICAL IMPRESSION(S) / ED DIAGNOSES  Final diagnoses:  Vaginal bleeding in pregnancy, first trimester  Asymptomatic bacteriuria    Medications  acetaminophen (TYLENOL) tablet 1,000 mg (has no administration in time range)     ED Discharge Orders          Ordered    fosfomycin (MONUROL) 3 g PACK  Every 48 hours        10/11/20 1155             Note:  This document was prepared using Dragon voice recognition software and may include unintentional dictation errors.    Gilles Chiquito, MD 10/11/20 [phone]

## 2020-10-11 NOTE — ED Triage Notes (Signed)
Pt comes with c/o vaginal bleeding that started last  night. Pt states she is [redacted] weeks pregnant. Pt states heavy bleeding and cramping.

## 2020-10-11 NOTE — Discharge Instructions (Addendum)
Your [place] today showed: Probable early intrauterine gestational sac with yolk sac, but no fetal pole or cardiac activity yet visualized. Recommend follow-up quantitative B-HCG levels and follow-up US in 14 days to assess viability. This recommendation follows SRU consensus guidelines: Diagnostic Criteria for Nonviable Pregnancy Early in the First Trimester. Malva Limes Med 2013; 707:8675-44.

## 2020-10-13 LAB — URINE CULTURE: Culture: <10000 — AB

## 2020-11-20 DIAGNOSIS — Z3483 Encounter for supervision of other normal pregnancy, third trimester: Secondary | ICD-10-CM | POA: Insufficient documentation

## 2020-12-20 DIAGNOSIS — Z20822 Contact with and (suspected) exposure to covid-19: Secondary | ICD-10-CM | POA: Diagnosis present

## 2020-12-20 DIAGNOSIS — R1031 Right lower quadrant pain: Secondary | ICD-10-CM | POA: Diagnosis present

## 2020-12-20 DIAGNOSIS — Z3A15 15 weeks gestation of pregnancy: Secondary | ICD-10-CM

## 2020-12-20 DIAGNOSIS — O26892 Other specified pregnancy related conditions, second trimester: Principal | ICD-10-CM | POA: Diagnosis present

## 2020-12-20 DIAGNOSIS — O99512 Diseases of the respiratory system complicating pregnancy, second trimester: Secondary | ICD-10-CM | POA: Diagnosis present

## 2020-12-20 DIAGNOSIS — J45909 Unspecified asthma, uncomplicated: Secondary | ICD-10-CM | POA: Diagnosis present

## 2020-12-20 DIAGNOSIS — O99212 Obesity complicating pregnancy, second trimester: Secondary | ICD-10-CM | POA: Diagnosis present

## 2020-12-21 ENCOUNTER — Encounter: Payer: Self-pay | Admitting: Emergency Medicine

## 2020-12-21 ENCOUNTER — Emergency Department: Payer: BC Managed Care – PPO

## 2020-12-21 ENCOUNTER — Other Ambulatory Visit: Payer: Self-pay

## 2020-12-21 ENCOUNTER — Inpatient Hospital Stay
Admission: EM | Admit: 2020-12-21 | Discharge: 2020-12-23 | DRG: 819 | Disposition: A | Discharge status: Home / Self Care | Payer: BC Managed Care – PPO | Attending: Obstetrics and Gynecology | Admitting: Obstetrics and Gynecology

## 2020-12-21 DIAGNOSIS — R1031 Right lower quadrant pain: Principal | ICD-10-CM | POA: Diagnosis present

## 2020-12-21 DIAGNOSIS — Z3A15 15 weeks gestation of pregnancy: Secondary | ICD-10-CM | POA: Diagnosis not present

## 2020-12-21 DIAGNOSIS — O99212 Obesity complicating pregnancy, second trimester: Secondary | ICD-10-CM | POA: Diagnosis present

## 2020-12-21 DIAGNOSIS — J45909 Unspecified asthma, uncomplicated: Secondary | ICD-10-CM | POA: Diagnosis present

## 2020-12-21 DIAGNOSIS — O26892 Other specified pregnancy related conditions, second trimester: Secondary | ICD-10-CM | POA: Diagnosis present

## 2020-12-21 DIAGNOSIS — Z20822 Contact with and (suspected) exposure to covid-19: Secondary | ICD-10-CM | POA: Diagnosis present

## 2020-12-21 DIAGNOSIS — O99512 Diseases of the respiratory system complicating pregnancy, second trimester: Secondary | ICD-10-CM | POA: Diagnosis present

## 2020-12-21 LAB — COMPREHENSIVE METABOLIC PANEL
ALT: 30 U/L (ref 0–44)
AST: 23 U/L (ref 15–41)
Albumin: 3.4 g/dL — ABNORMAL LOW (ref 3.5–5.0)
Alkaline Phosphatase: 44 U/L (ref 38–126)
Anion gap: 3 — ABNORMAL LOW (ref 5–15)
BUN: 7 mg/dL (ref 6–20)
CO2: 27 mmol/L (ref 22–32)
Calcium: 9 mg/dL (ref 8.9–10.3)
Chloride: 105 mmol/L (ref 98–111)
Creatinine, Ser: 0.52 mg/dL (ref 0.44–1.00)
GFR, Estimated: >60 mL/min (ref >60)
Glucose, Bld: 67 mg/dL — ABNORMAL LOW (ref 70–99)
Potassium: 3.4 mmol/L — ABNORMAL LOW (ref 3.5–5.1)
Sodium: 135 mmol/L (ref 135–145)
Total Bilirubin: 0.6 mg/dL (ref 0.3–1.2)
Total Protein: 6.7 g/dL (ref 6.5–8.1)

## 2020-12-21 LAB — URINALYSIS, ROUTINE W REFLEX MICROSCOPIC
Bilirubin Urine: NEGATIVE
Glucose, UA: NEGATIVE mg/dL
Hgb urine dipstick: NEGATIVE
Ketones, ur: NEGATIVE mg/dL
Leukocytes,Ua: NEGATIVE
Nitrite: NEGATIVE
Protein, ur: NEGATIVE mg/dL
Specific Gravity, Urine: 1.004 — ABNORMAL LOW (ref 1.005–1.030)
pH: 6 (ref 5.0–8.0)

## 2020-12-21 LAB — CBC WITH DIFFERENTIAL/PLATELET
Abs Immature Granulocytes: 0.12 10*3/uL — ABNORMAL HIGH (ref 0.00–0.07)
Abs Immature Granulocytes: 0.09 10*3/uL — ABNORMAL HIGH (ref 0.00–0.07)
Basophils Absolute: 0.1 10*3/uL (ref 0.0–0.1)
Basophils Absolute: 0 10*3/uL (ref 0.0–0.1)
Basophils Relative: 0 %
Basophils Relative: 0 %
Eosinophils Absolute: 0.1 10*3/uL (ref 0.0–0.5)
Eosinophils Absolute: 0.1 10*3/uL (ref 0.0–0.5)
Eosinophils Relative: 0 %
Eosinophils Relative: 1 %
HCT: 39.3 % (ref 36.0–46.0)
HCT: 42.3 % (ref 36.0–46.0)
Hemoglobin: 13 g/dL (ref 12.0–15.0)
Hemoglobin: 14.2 g/dL (ref 12.0–15.0)
Immature Granulocytes: 1 %
Immature Granulocytes: 1 %
Lymphocytes Relative: 22 %
Lymphocytes Relative: 20 %
Lymphs Abs: 4.5 10*3/uL — ABNORMAL HIGH (ref 0.7–4.0)
Lymphs Abs: 3.3 10*3/uL (ref 0.7–4.0)
MCH: 31.7 pg (ref 26.0–34.0)
MCH: 30.7 pg (ref 26.0–34.0)
MCHC: 33.1 g/dL (ref 30.0–36.0)
MCHC: 33.6 g/dL (ref 30.0–36.0)
MCV: 95.9 fL (ref 80.0–100.0)
MCV: 91.4 fL (ref 80.0–100.0)
Monocytes Absolute: 1.6 10*3/uL — ABNORMAL HIGH (ref 0.1–1.0)
Monocytes Absolute: 1 10*3/uL (ref 0.1–1.0)
Monocytes Relative: 8 %
Monocytes Relative: 6 %
Neutro Abs: 14.1 10*3/uL — ABNORMAL HIGH (ref 1.7–7.7)
Neutro Abs: 11.7 10*3/uL — ABNORMAL HIGH (ref 1.7–7.7)
Neutrophils Relative %: 69 %
Neutrophils Relative %: 72 %
Platelets: 282 10*3/uL (ref 150–400)
Platelets: 226 10*3/uL (ref 150–400)
RBC: 4.1 MIL/uL (ref 3.87–5.11)
RBC: 4.63 MIL/uL (ref 3.87–5.11)
RDW: 13.5 % (ref 11.5–15.5)
RDW: 13.2 % (ref 11.5–15.5)
WBC: 20.5 10*3/uL — ABNORMAL HIGH (ref 4.0–10.5)
WBC: 16.2 10*3/uL — ABNORMAL HIGH (ref 4.0–10.5)
nRBC: 0 % (ref 0.0–0.2)
nRBC: 0 % (ref 0.0–0.2)

## 2020-12-21 LAB — CBC
HCT: 38 % (ref 36.0–46.0)
Hemoglobin: 13.1 g/dL (ref 12.0–15.0)
MCH: 32.3 pg (ref 26.0–34.0)
MCHC: 34.5 g/dL (ref 30.0–36.0)
MCV: 93.6 fL (ref 80.0–100.0)
Platelets: 267 10*3/uL (ref 150–400)
RBC: 4.06 MIL/uL (ref 3.87–5.11)
RDW: 13 % (ref 11.5–15.5)
WBC: 20.5 10*3/uL — ABNORMAL HIGH (ref 4.0–10.5)
nRBC: 0 % (ref 0.0–0.2)

## 2020-12-21 LAB — CHLAMYDIA/NGC RT PCR (ARMC ONLY)
Chlamydia Tr: NOT DETECTED
N gonorrhoeae: NOT DETECTED

## 2020-12-21 LAB — LIPASE, BLOOD: Lipase: 29 U/L (ref 11–51)

## 2020-12-21 IMAGING — CR DG CHEST 2V
1 series · 2 of 2 positions shown · non-contrast
Comparison: None.

CLINICAL DATA: Infection of unknown origin.

EXAM:
CHEST - 2 VIEW

[Series 1: dg chest 2 view · 0.14mm/px · 2 of 2 slices shown]
[im 1/2]
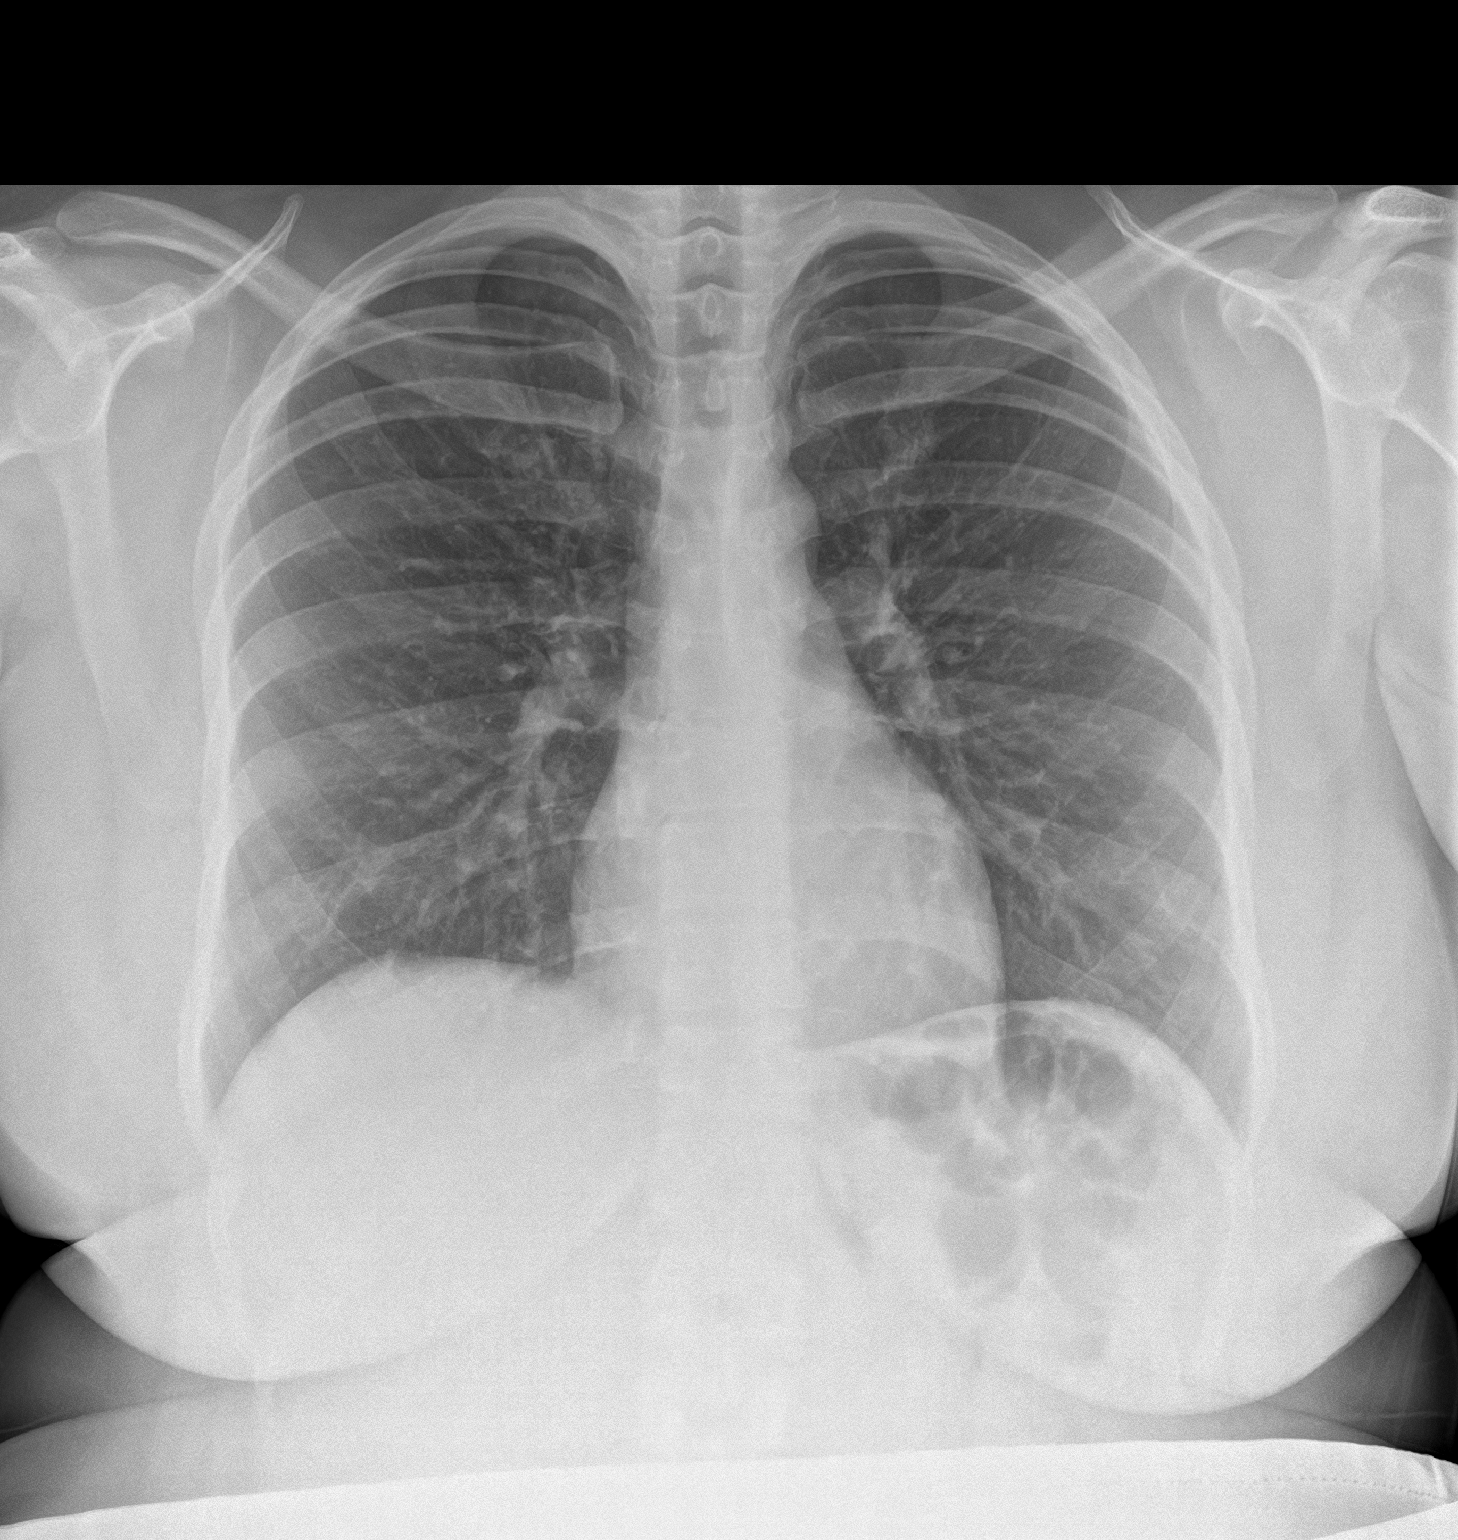
[im 2/2]
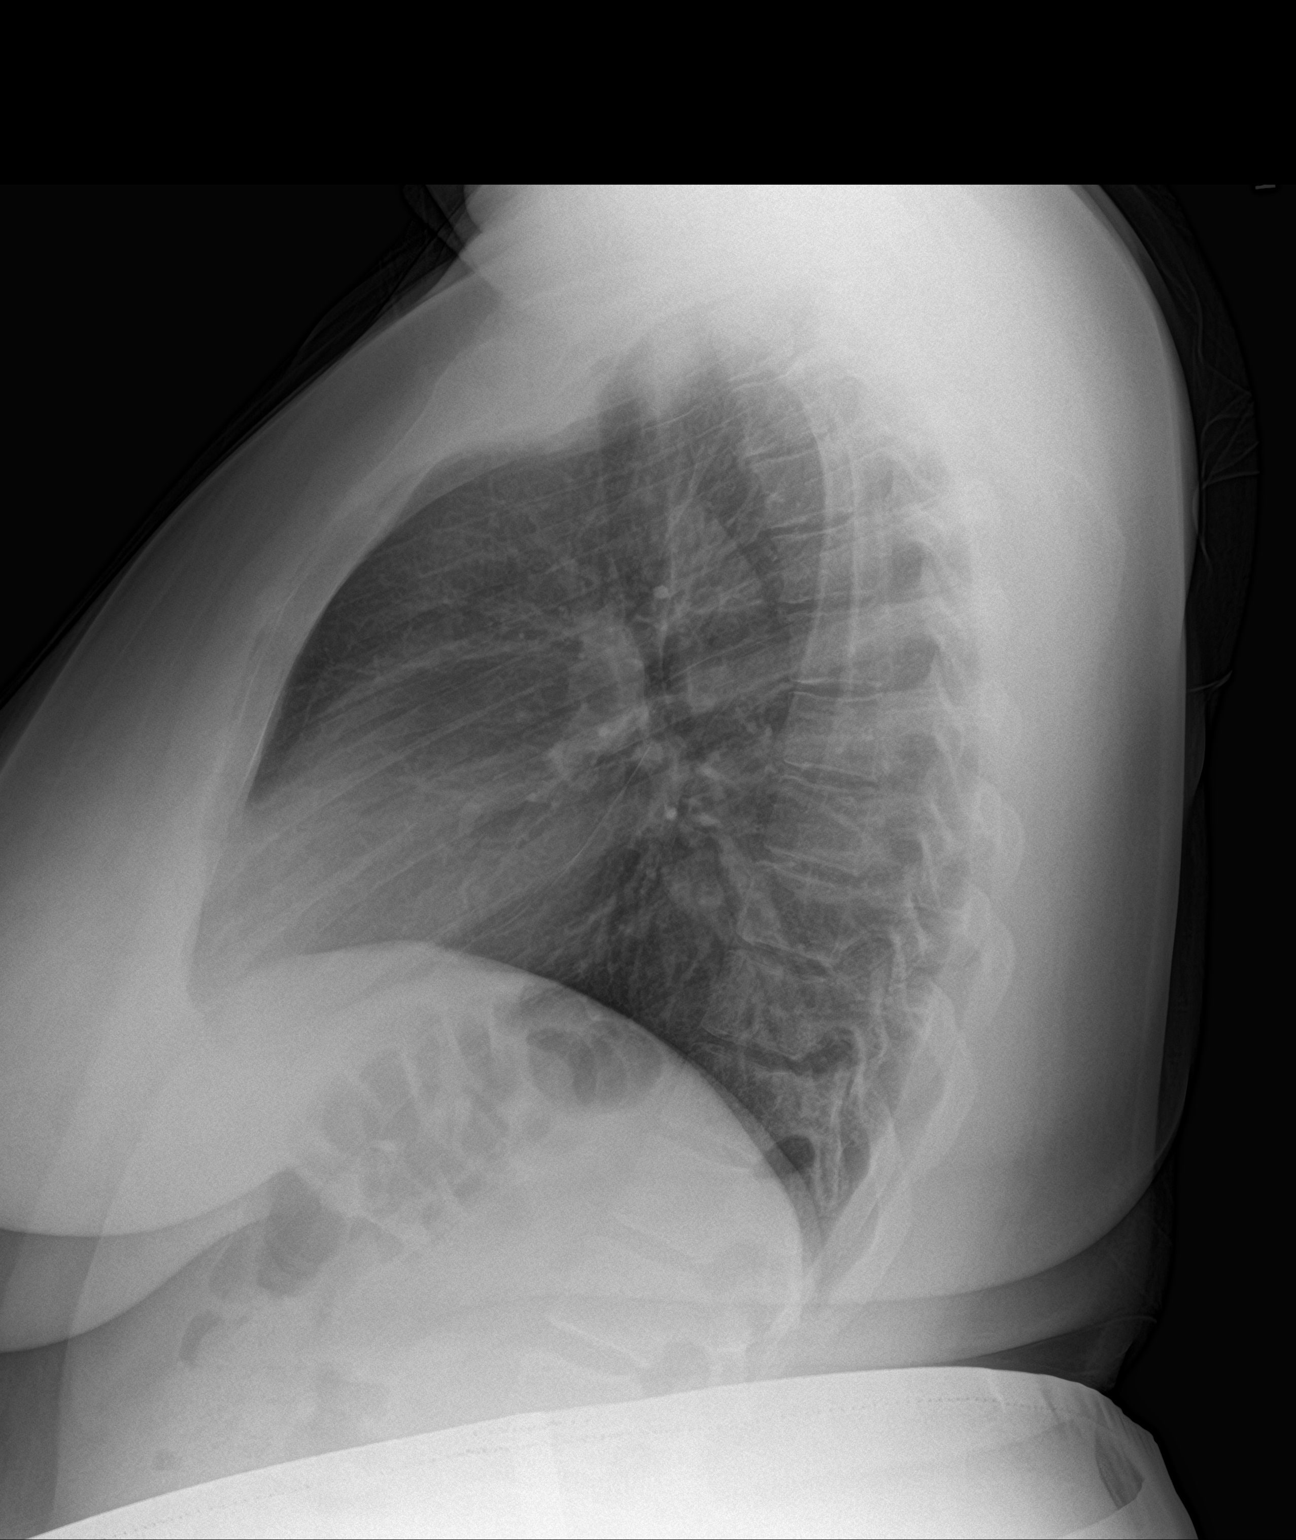

[2 of 2 positions shown; findings below may reference images not displayed]

FINDINGS: The cardiac silhouette, mediastinal and hilar contours are within
normal limits. The lungs are clear. No pleural effusions. The bony
structures are intact.
IMPRESSION: No acute cardiopulmonary findings.

## 2020-12-21 IMAGING — MR MR PELVIS W/O CM
6 of 7 series · 30 of 48 positions shown · non-contrast
Comparison: None.

CLINICAL DATA: Right-sided pelvic pain for 2 days.

EXAM:
MRI ABDOMEN AND PELVIS WITHOUT CONTRAST
TECHNIQUE: Multiplanar multisequence MR imaging of the abdomen and pelvis was
performed. No intravenous contrast was administered.

[Series 4: cor haste · coronal · 5.0mm · 1.25mm/px · 4 of 40 slices shown]
[im 1/40]
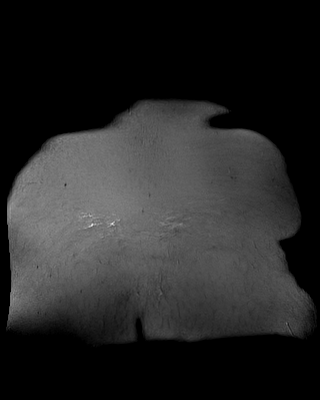
[im 14/40]
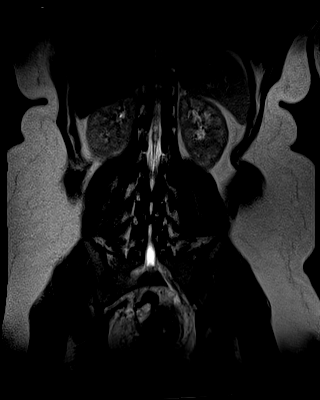
[im 27/40]
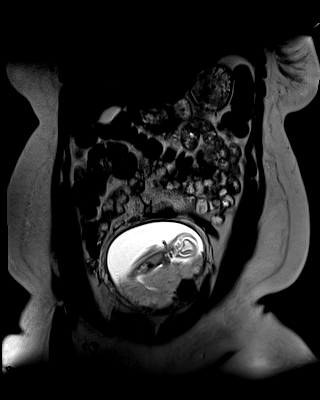
[im 40/40]
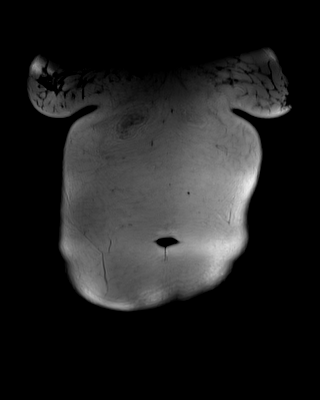

[Series 5: cor haste fs · coronal · 5.0mm · 1.25mm/px · 4 of 40 slices shown]
[im 1/40]
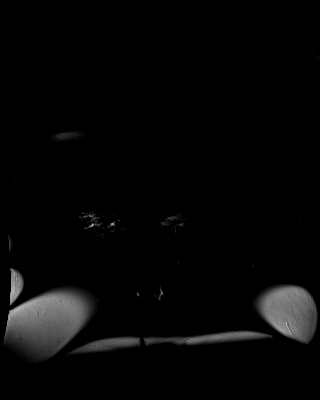
[im 14/40]
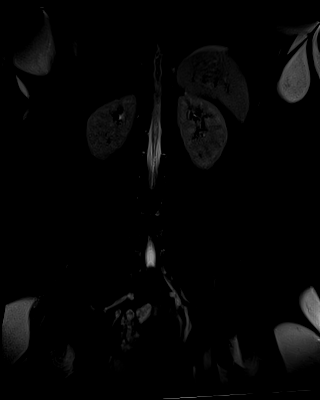
[im 27/40]
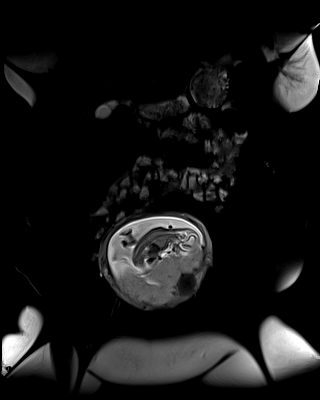
[im 40/40]
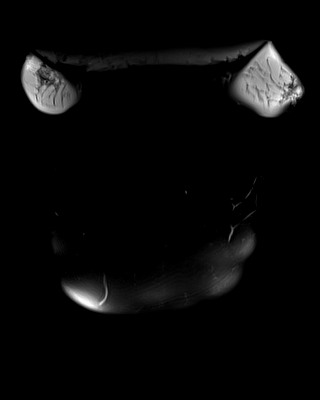

[Series 6: bSSFP · coronal · 5.0mm · 0.78mm/px · 3 of 40 slices shown (1 of 2)]
[im 1/40]
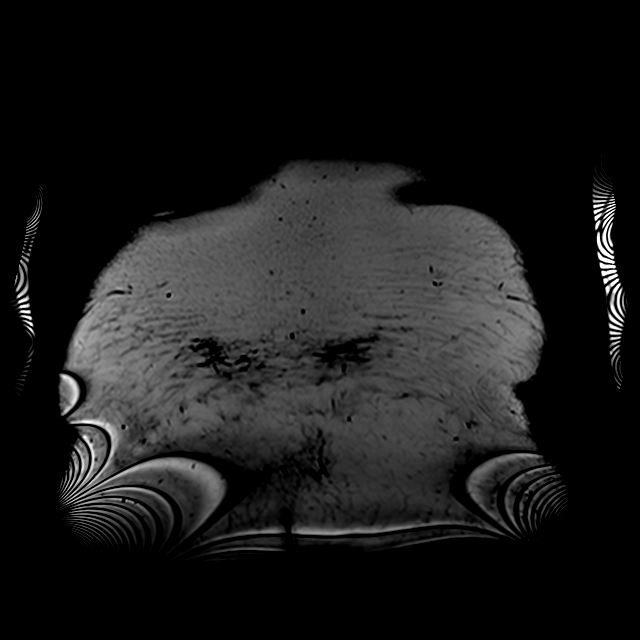
[im 20/40]
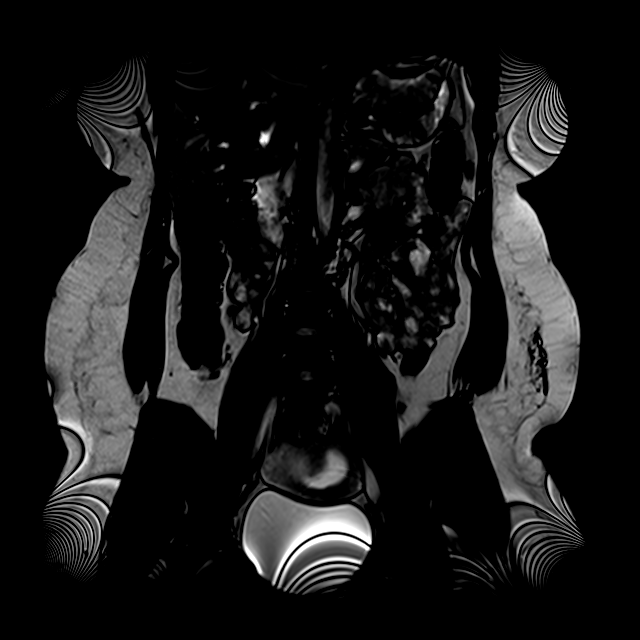
[im 40/40]
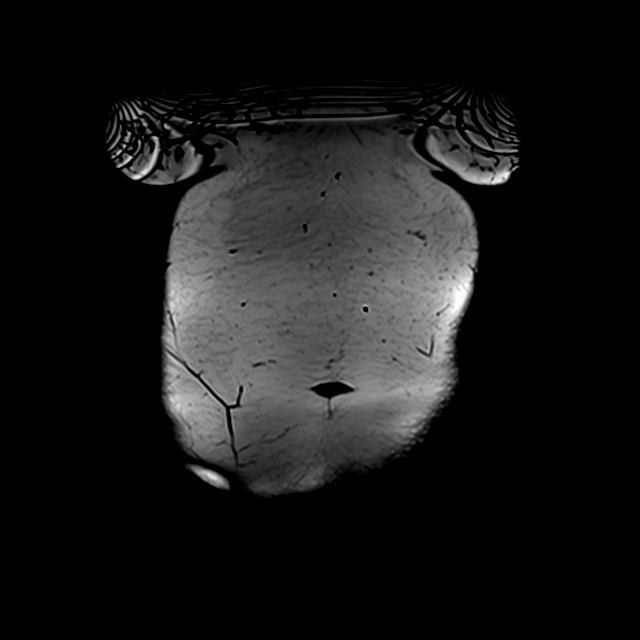

[Series 9: T2 · axial · 4.0mm · 1.19mm/px · z∈[-319,+163]mm · 8 of 102 slices shown (1 of 2)]
[im 1/102]
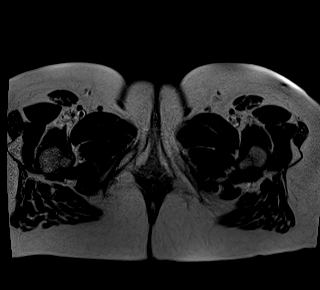
[im 15/102]
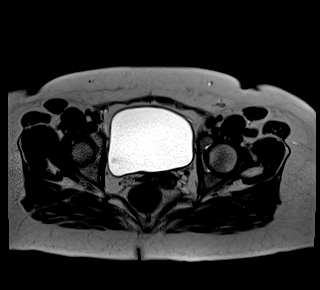
[im 29/102]
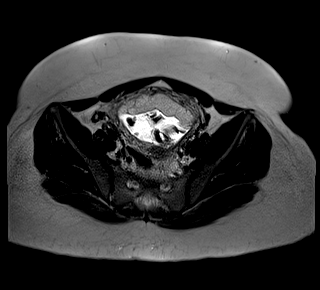
[im 44/102]
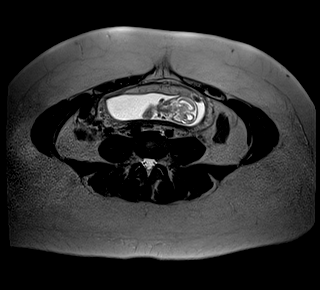
[im 58/102]
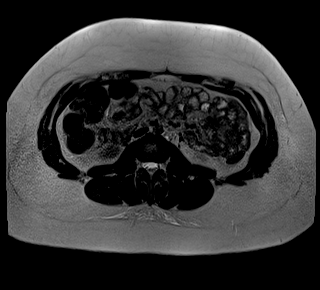
[im 73/102]
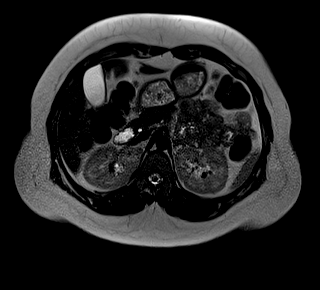
[im 87/102]
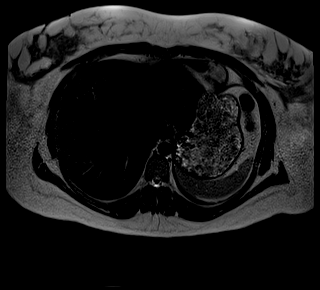
[im 102/102]
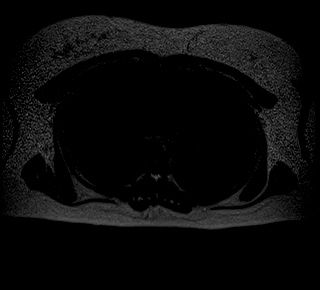

[Series 12: T2 · axial · 4.0mm · 1.19mm/px · z∈[-319,+163]mm · 8 of 102 slices shown (2 of 2)]
[im 1/102]
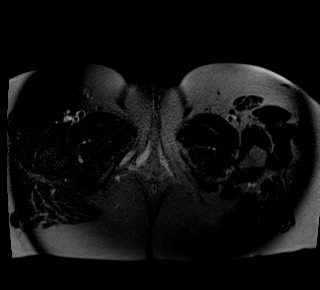
[im 15/102]
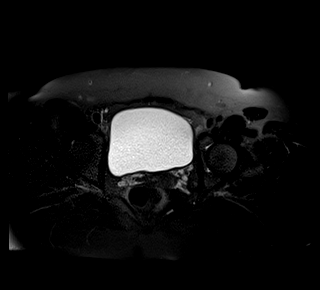
[im 29/102]
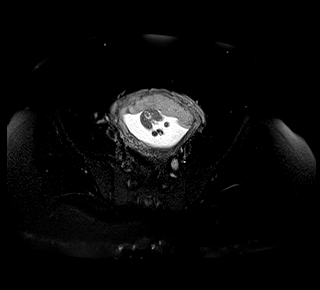
[im 44/102]
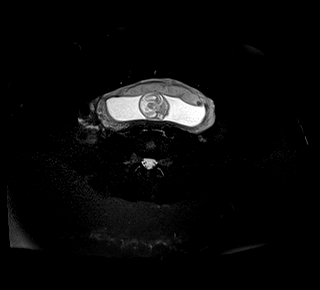
[im 58/102]
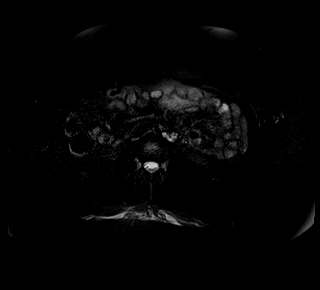
[im 73/102]
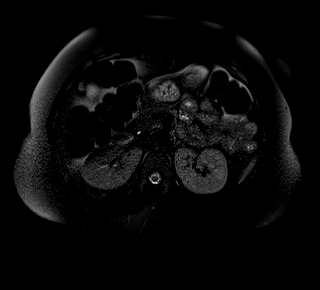
[im 87/102]
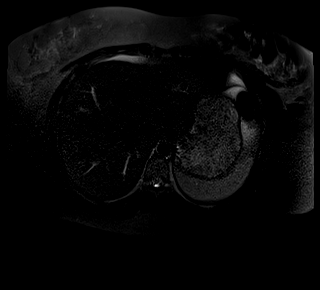
[im 102/102]
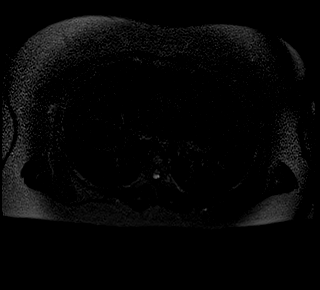

[Series 15: bSSFP · axial · 4.0mm · 0.59mm/px · z∈[-319,-185]mm · 3 of 102 slices shown (2 of 2)]
[im 1/102]
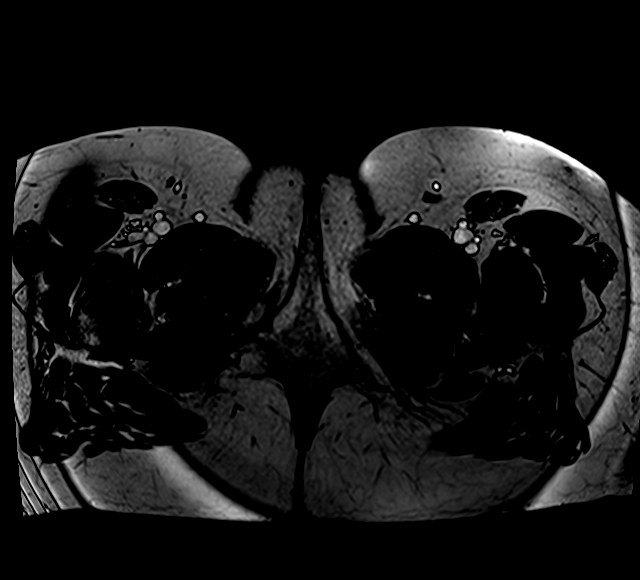
[im 15/102]
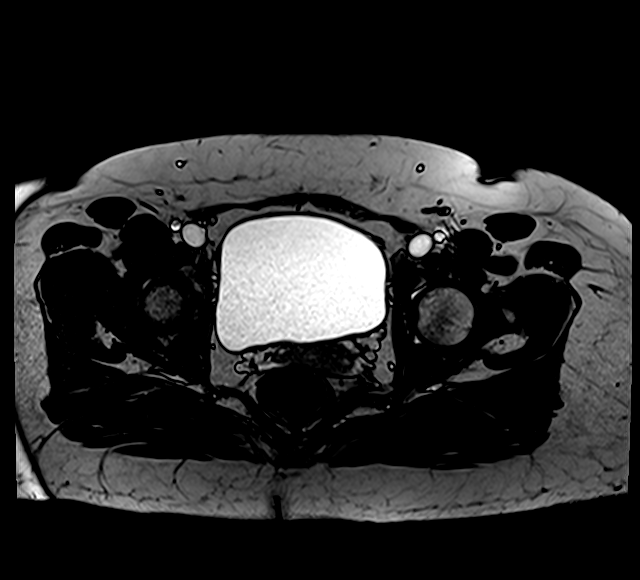
[im 29/102]
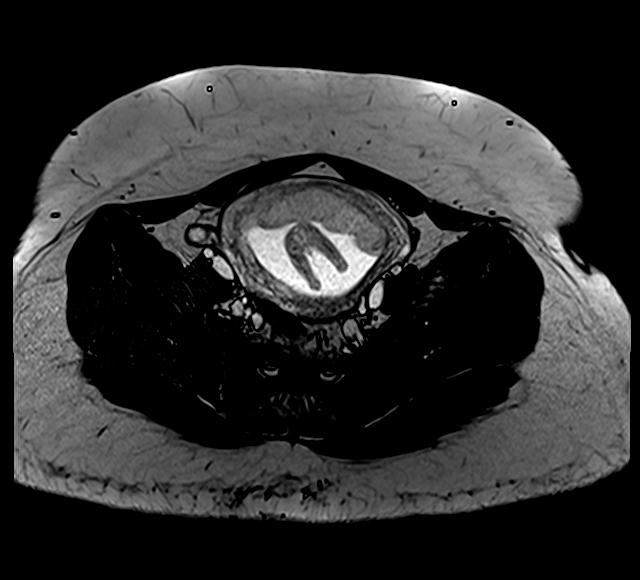

[30 of 48 positions shown; findings below may reference images not displayed]

FINDINGS: COMBINED FINDINGS FOR BOTH MR ABDOMEN AND PELVIS

Lower chest: The lung bases are clear of an acute process. No
pleural or pericardial effusion.

Hepatobiliary: The liver is unremarkable. No hepatic lesions or
intrahepatic biliary dilatation. The gallbladder is normal. No
common bile duct dilatation.

Pancreas:  No mass, inflammation or ductal dilatation.

Spleen:  Normal size.  No focal lesions.

Adrenals/Urinary Tract: Adrenal glands and kidneys are unremarkable.
No hydronephrosis or findings suspicious for pyelonephritis.

Stomach/Bowel: The stomach, duodenum, small bowel and colon are
grossly normal. No acute inflammatory process, mass lesion or
obstructive findings. The appendix is not identified for certain but
no findings suspicious for acute appendicitis.

Vascular/Lymphatic: The aorta is normal in caliber. No abdominal or
pelvic lymphadenopathy.

Reproductive: Gravid uterus with grossly normal fetus. Normal
appearing anterior placenta and normal amniotic fluid volume. A
small myometrial fibroid is noted on the left side. Both ovaries are
normal.

Other:  No free abdominal or free pelvic fluid collections.

Musculoskeletal: No significant bony findings.
IMPRESSION: 1. The appendix is not identified for certain but no findings
suspicious for acute appendicitis.
2. No acute abdominal or pelvic findings, mass lesion or adenopathy.
3. Gravid uterus with grossly normal fetus, normal placenta and
normal amniotic fluid volume.

## 2020-12-21 MED ORDER — DOCUSATE SODIUM 100 MG PO CAPS
100.0000 mg | ORAL_CAPSULE | Freq: Every day | ORAL | Status: DC
  Filled 2020-12-21: qty 1

## 2020-12-21 MED ORDER — ZOLPIDEM TARTRATE 5 MG PO TABS: 5.0000 mg | ORAL_TABLET | Freq: Every evening | ORAL | Status: DC | PRN

## 2020-12-21 MED ORDER — LACTATED RINGERS IV SOLN
INTRAVENOUS | Status: DC
Start: 2020-12-21 — End: 2020-12-23

## 2020-12-21 MED ORDER — CALCIUM CARBONATE ANTACID 500 MG PO CHEW: 2.0000 | CHEWABLE_TABLET | ORAL | Status: DC | PRN

## 2020-12-21 MED ORDER — ACETAMINOPHEN 325 MG PO TABS
650.0000 mg | ORAL_TABLET | ORAL | Status: DC | PRN
  Administered 2020-12-21: 650 mg via ORAL
  Filled 2020-12-21: qty 2

## 2020-12-21 MED ORDER — SODIUM CHLORIDE 0.9 % IV SOLN
1.0000 g | Freq: Three times a day (TID) | INTRAVENOUS | Status: DC
  Administered 2020-12-21 – 2020-12-23 (×5): 1 g via INTRAVENOUS
  Filled 2020-12-21 (×7): qty 1

## 2020-12-21 MED ORDER — PRENATAL MULTIVITAMIN CH: 1.0000 | ORAL_TABLET | Freq: Every day | ORAL | Status: DC

## 2020-12-21 NOTE — Consult Note (Addendum)
Consult History and Physical   SERVICE: Ob/Gyn  Patient Name: Chelsea Underwood Patient MRN:   [id number]  CC: RLQ pain   HPI: Chelsea Underwood is a 25 y.o. G2P0010 at [redacted]w[redacted]d with worsening RLQ that has increased over the last 2 days. Also have nausea that is a new symptom (pregnancy nausea had resolved). Pt states that she initially thought the pain was normal stretching and ligament pain, but the pain has continued to worsen, and is not resolved with tylenol, rest or hydration. She denies fever. Pain is worse in some positions, walking makes it worse.    Review of Systems: positives in bold GEN:   fevers, chills +  HEENT:  HA, vision changes, congestion GI:  abd pain+  Nausea+ V/D/C GU:  dysuria, urgency, frequency, NO VB, LOF. Reports active FM noted.  MSK:  arthralgias, myalgias, back pain, swelling SKIN:  rashes, color changes, pallor NEURO:  numbness, weakness, tingling, seizures, dizziness, tremors PSYCH:  depression, anxiety, behavioral problems, confusion  HEME/LYMPH:  easy bruising or bleeding ENDO:  heat/cold intolerance  Past Obstetrical History: OB History     Gravida  2   Para      Term      Preterm      AB  1   Living         SAB      IAB      Ectopic      Multiple      Live Births            Prenatal care started at [facility], initial visit on 12/02/20; EDD 06/08/21 based on early [place] and c/w LMP dating.   Pregnancy complicated by:  Asthma eye condition- swelling of retina (cleared by eye MD) hx mental health dx- no current meds, has regular counseling.  Obesity, BMI 36.9 refuses blood tx unless donated by family member  Past Gynecologic History: Patient's last menstrual period was 06/12/2020.   Past Medical History: Past Medical History:  Diagnosis Date   Asthma     Past Surgical History:  No past surgical history on file.  Family History:  family history is not on file.  Social History:  Social History    Socioeconomic History   Marital status: Single    Spouse name: Not on file   Number of children: Not on file   Years of education: Not on file   Highest education level: Not on file  Occupational History   Not on file  Tobacco Use   Smoking status: Never   Smokeless tobacco: Never  Substance and Sexual Activity   Alcohol use: Yes   Drug use: Not on file   Sexual activity: Yes  Other Topics Concern   Not on file  Social History Narrative   Not on file   Social Determinants of Health   Financial Resource Strain: Not on file  Food Insecurity: Not on file  Transportation Needs: Not on file  Physical Activity: Not on file  Stress: Not on file  Social Connections: Not on file  Intimate Partner Violence: Not on file    Home Medications:  Medications reconciled in EPIC  No current facility-administered medications on file prior to encounter.   No current outpatient medications on file prior to encounter.    Allergies:  Allergies  Allergen Reactions   Amoxicillin Anaphylaxis and Itching   Cefdinir Anaphylaxis, Shortness Of Breath and Swelling    Other reaction(s): Facial Swelling, Wheezing   Other Anaphylaxis  Tree Nuts   Peanut Butter Flavor Anaphylaxis   Peanut Oil Anaphylaxis and Shortness Of Breath   Penicillin V Anaphylaxis   Apple Itching   Peanut-Containing Drug Products    Penicillins     Physical Exam:  Temp:  [97.9 F (36.6 C)-98.6 F (37 C)] 97.9 F (36.6 C) (10/29 0454) Pulse Rate:  [76-87] 86 (10/29 1442) Resp:  [14-20] 14 (10/29 1442) BP: (105-124)/(59-74) 105/59 (10/29 1442) SpO2:  [98 %-100 %] 100 % (10/29 1442) Weight:  [90.7 kg] 90.7 kg (10/29 0036)   General Appearance:  Well developed, well nourished, no acute distress, alert and oriented x3 HEENT:  Normocephalic atraumatic Abdomen:  Gravid,  soft, nondistended, no abnormal masses, no epigastric pain, RLQ pain, severe with light palpation. NO fundal tenderness, just pain radiated to  RLQ with exam.  Extremities:  Full range of motion, no pedal edema, 2+ distal pulses, no tenderness Skin:  normal coloration and turgor, no rashes Psychiatric:  Normal mood and affect, appropriate, no AH/VH Pelvic:  NEFG, no vulvar masses or lesions, normal vaginal mucosa, no vaginal bleeding or discharge, cervix closed without lesions or erythema, gravid uterus, fundal height c/w GA   Labs/Studies:   CBC and Coags:  Lab Results  Component Value Date   WBC 20.5 (H) 12/21/2020   NEUTOPHILPCT 69 12/21/2020   EOSPCT 0 12/21/2020   BASOPCT 0 12/21/2020   LYMPHOPCT 22 12/21/2020   HGB 13.1 12/21/2020   HCT 38.0 12/21/2020   MCV 93.6 12/21/2020   PLT 267 12/21/2020   CMP:  Lab Results  Component Value Date   NA 135 12/21/2020   K 3.4 (L) 12/21/2020   CL 105 12/21/2020   CO2 27 12/21/2020   BUN 7 12/21/2020   CREATININE 0.52 12/21/2020   CREATININE 0.69 06/30/2020   PROT 6.7 12/21/2020   BILITOT 0.6 12/21/2020   ALT 30 12/21/2020   AST 23 12/21/2020   ALKPHOS 44 12/21/2020    TVUS:   Other Imaging: DG Chest 2 View  Result Date: 12/21/2020 CLINICAL DATA:  Infection of unknown origin. EXAM: CHEST - 2 VIEW COMPARISON:  None. FINDINGS: The cardiac silhouette, mediastinal and hilar contours are within normal limits. The lungs are clear. No pleural effusions. The bony structures are intact. IMPRESSION: No acute cardiopulmonary findings. Electronically Signed   By: Rudie Meyer M.D.   On: 12/21/2020 10:29   MR PELVIS WO CONTRAST  Result Date: 12/21/2020 CLINICAL DATA:  Right-sided pelvic pain for 2 days. EXAM: MRI ABDOMEN AND PELVIS WITHOUT CONTRAST TECHNIQUE: Multiplanar multisequence MR imaging of the abdomen and pelvis was performed. No intravenous contrast was administered. COMPARISON:  None. FINDINGS: COMBINED FINDINGS FOR BOTH MR ABDOMEN AND PELVIS Lower chest: The lung bases are clear of an acute process. No pleural or pericardial effusion. Hepatobiliary: The liver is  unremarkable. No hepatic lesions or intrahepatic biliary dilatation. The gallbladder is normal. No common bile duct dilatation. Pancreas:  No mass, inflammation or ductal dilatation. Spleen:  Normal size.  No focal lesions. Adrenals/Urinary Tract: Adrenal glands and kidneys are unremarkable. No hydronephrosis or findings suspicious for pyelonephritis. Stomach/Bowel: The stomach, duodenum, small bowel and colon are grossly normal. No acute inflammatory process, mass lesion or obstructive findings. The appendix is not identified for certain but no findings suspicious for acute appendicitis. Vascular/Lymphatic: The aorta is normal in caliber. No abdominal or pelvic lymphadenopathy. Reproductive: Gravid uterus with grossly normal fetus. Normal appearing anterior placenta and normal amniotic fluid volume. A small myometrial fibroid is noted on  the left side. Both ovaries are normal. Other:  No free abdominal or free pelvic fluid collections. Musculoskeletal: No significant bony findings. IMPRESSION: 1. The appendix is not identified for certain but no findings suspicious for acute appendicitis. 2. No acute abdominal or pelvic findings, mass lesion or adenopathy. 3. Gravid uterus with grossly normal fetus, normal placenta and normal amniotic fluid volume. Electronically Signed   By: Rudie Meyer M.D.   On: 12/21/2020 14:26   MR ABDOMEN WO CONTRAST  Result Date: 12/21/2020 CLINICAL DATA:  Right-sided pelvic pain for 2 days. EXAM: MRI ABDOMEN AND PELVIS WITHOUT CONTRAST TECHNIQUE: Multiplanar multisequence MR imaging of the abdomen and pelvis was performed. No intravenous contrast was administered. COMPARISON:  None. FINDINGS: COMBINED FINDINGS FOR BOTH MR ABDOMEN AND PELVIS Lower chest: The lung bases are clear of an acute process. No pleural or pericardial effusion. Hepatobiliary: The liver is unremarkable. No hepatic lesions or intrahepatic biliary dilatation. The gallbladder is normal. No common bile duct  dilatation. Pancreas:  No mass, inflammation or ductal dilatation. Spleen:  Normal size.  No focal lesions. Adrenals/Urinary Tract: Adrenal glands and kidneys are unremarkable. No hydronephrosis or findings suspicious for pyelonephritis. Stomach/Bowel: The stomach, duodenum, small bowel and colon are grossly normal. No acute inflammatory process, mass lesion or obstructive findings. The appendix is not identified for certain but no findings suspicious for acute appendicitis. Vascular/Lymphatic: The aorta is normal in caliber. No abdominal or pelvic lymphadenopathy. Reproductive: Gravid uterus with grossly normal fetus. Normal appearing anterior placenta and normal amniotic fluid volume. A small myometrial fibroid is noted on the left side. Both ovaries are normal. Other:  No free abdominal or free pelvic fluid collections. Musculoskeletal: No significant bony findings. IMPRESSION: 1. The appendix is not identified for certain but no findings suspicious for acute appendicitis. 2. No acute abdominal or pelvic findings, mass lesion or adenopathy. 3. Gravid uterus with grossly normal fetus, normal placenta and normal amniotic fluid volume. Electronically Signed   By: Rudie Meyer M.D.   On: 12/21/2020 14:26   US OB Limited  Result Date: 12/21/2020 CLINICAL DATA:  Pregnant, abdominal pain. EXAM: LIMITED OBSTETRIC ULTRASOUND COMPARISON:  10/11/2020 FINDINGS: Number of Fetuses: 1 Heart Rate:  169 bpm Movement: Yes Presentation: Cephalic Placental Location: Anterior uterine body/lower uterine segment Previa: Complete Amniotic Fluid (Subjective):  Within normal limits. AFI:   Not specifically measured BPD: 3.1 cm 15 w  6 d MATERNAL FINDINGS: Cervix:  Appears closed.  No funneling.  Cervical length 4 cm Uterus/Adnexae: No uterine masses are identified. The maternal ovaries are not visualized. No free fluid within the pelvis. IMPRESSION: Single living intrauterine gestation with an estimated gestational age of [redacted] weeks,  6 days. Complete placenta previa. This exam is performed on an emergent basis and does not comprehensively evaluate fetal size, dating, or anatomy; follow-up complete OB US should be considered if further fetal assessment is warranted. Electronically Signed   By: Helyn Numbers M.D.   On: 12/21/2020 02:07     Assessment / Plan:   Nathania Underwood is a 25 y.o. G2P0010 at [redacted]w[redacted]d who presents with RLQ pain  - No acute Obstetric complication noted on exam - per [place], complete previa noted.  - no fundal tenderness, no e/o GU infection.  - d/w Dr Dalbert Garnet, recommend repeat CBC and CT abd/pelvis. Ddx acute appendicitis   Thank you for the opportunity to be involved with this pt's care.   Randa Ngo, CNM 12/21/2020 3:46 PM

## 2020-12-21 NOTE — ED Provider Notes (Signed)
Patient signed out to me at 3 PM.  [redacted] weeks pregnant with right lower quad abdominal pain.  White blood count of 20 and negative MRI.  I spoke with radiology who recommends against getting a CT as it as not likely to add much and will give additional radiation.  Spoke with surgery on-call Dr. Maia Plan who does not want to admit the patient, given her pregnancy status and unclear diagnosis.  OB had initially evaluated the patient and did not think that this was gynecologic in in origin.  Spoke again with Dr. Dalbert Garnet who then discussed with Dr. Maia Plan and agreed to admit the patient and have surgery follow along.  We will hold off on antibiotics.  Her repeat CBC is downtrending with a white count of 16 now.   Georga Hacking, MD 12/21/20 1700

## 2020-12-21 NOTE — ED Provider Notes (Signed)
[facility] Emergency Department Provider Note ____________________________________________  Time seen: 0945  I have reviewed the triage vital signs and the nursing notes.  HISTORY  Chief Complaint  Abdominal Pain   HPI Chelsea Underwood is a 25 y.o. female presents to the ER today with complaint of right-sided pelvic pain.  She reports this started 2 days ago.  She describes the pain as a constant ache. The pain radiates into her right groin. The pain is worse with ambulation. She reports associated headache, lightheadedness, nausea, vomiting, and vaginal discharge. The Headache is located in her right temple.  She describes the pain as pressure. She denies runny nose, nasal congestion, ear pain, sore throat, cough or shortness of breath. She feels like the nausea and vomiting is just related to being pregnant.  She reports the vaginal discharge is clear without odor.  She denies fever, chills or body aches.  Past Medical History:  Diagnosis Date   Asthma     There are no problems to display for this patient.   No past surgical history on file.  Prior to Admission medications   Not on File    Allergies Amoxicillin, Cefdinir, Other, Peanut butter flavor, Peanut oil, Penicillin v, Apple, Peanut-containing drug products, and Penicillins  No family history on file.  Social History Social History   Tobacco Use   Smoking status: Never   Smokeless tobacco: Never  Substance Use Topics   Alcohol use: Yes    Review of Systems  Constitutional: Negative for fever, chills or body aches. Eyes: Negative for visual changes, eye pain, eye redness or discharge. ENT: Negative for runny nose, nasal congestion, ear pain or sore throat. Cardiovascular: Negative for chest pain or chest tightness. Respiratory: Negative for cough or shortness of breath. Gastrointestinal: Positive for right side pelvic pain, nausea and vomiting. Negative for constipation and  diarrhea. Genitourinary: Positive for vaginal discharge. Negative for urinary urgency, frequency, dysuria or blood in her urine. Skin: Negative for rash. Neurological: Positive for headache, lightheadedness. Negative for weakness, tingling or numbness. ____________________________________________  PHYSICAL EXAM:  VITAL SIGNS: ED Triage Vitals  Enc Vitals Group     BP 12/21/20 0040 124/72     Pulse Rate 12/21/20 0040 81     Resp 12/21/20 0040 16     Temp 12/21/20 0040 98.6 F (37 C)     Temp Source 12/21/20 0040 Oral     SpO2 12/21/20 0040 99 %     Weight 12/21/20 0036 200 lb (90.7 kg)     Height 12/21/20 0036 5\' 3"  (1.6 m)     Head Circumference --      Peak Flow --      Pain Score 12/21/20 0036 7     Pain Loc --      Pain Edu? --      Excl. in GC? --     Constitutional: Alert and oriented. Well appearing and in no distress. Head: Normocephalic. Eyes: Normal extraocular movements Hematological/Lymphatic/Immunological: No cervical lymphadenopathy. Cardiovascular: Normal rate, regular rhythm.  Respiratory: Normal respiratory effort. No wheezes/rales/rhonchi noted. Gastrointestinal: Soft and nontender.  Musculoskeletal: No difficult to gait. Neurologic: Normal speech and language. No gross focal neurologic deficits are appreciated. Skin:  Skin is warm, dry and intact. No rash noted.  ____________________________________________   LABS Labs Reviewed  COMPREHENSIVE METABOLIC PANEL - Abnormal; Notable for the following components:      Result Value   Potassium 3.4 (*)    Glucose, Bld 67 (*)  Albumin 3.4 (*)    Anion gap 3 (*)    All other components within normal limits  CBC - Abnormal; Notable for the following components:   WBC 20.5 (*)    All other components within normal limits  URINALYSIS, ROUTINE W REFLEX MICROSCOPIC - Abnormal; Notable for the following components:   Color, Urine STRAW (*)    APPearance CLEAR (*)    Specific Gravity, Urine 1.004 (*)    All  other components within normal limits  CHLAMYDIA/NGC RT PCR ([facility] ONLY)            LIPASE, BLOOD    ____________________________________________  RADIOLOGY Imaging Orders         US OB Limited         DG Chest 2 View     ____________________________________________   INITIAL IMPRESSION / ASSESSMENT AND PLAN / ED COURSE  Acute Headache, Lightheadedness, RLQ Abdominal Pain, Vaginal Discharge:  DDx include viral illness, uti, gonorrhea, chlamydia, constipation, anemia in pregnancy Labs concerning for leukocytosis with a white count of 20.5 Urinalysis normal we will add on urine gonorrhea and chlamydia Ultrasound shows no acute findings Chest x-ray ____________________________________________  FINAL CLINICAL IMPRESSION(S) / ED DIAGNOSES  Final diagnoses:  None      Merwyn Katos, MD 12/25/20 [phone]

## 2020-12-21 NOTE — ED Provider Notes (Signed)
[facility] Emergency Department Provider Note   ____________________________________________   Event Date/Time   First MD Initiated Contact with Patient 12/21/20 1013     (approximate)  I have reviewed the triage vital signs and the nursing notes.   HISTORY  Chief Complaint Abdominal Pain   HPI Chelsea Underwood is a 25 y.o. female patient reports she has had 2 days of worsening right lower quadrant pain.  It got to the point now where it hurts to walk.  She is nauseated.  She is not running a fever.  She has not had this before.  She is about [redacted] weeks pregnant she says.  She is not having any problems with the pregnancy.  Pain is deep and achy.  Is been going on for 2 days is noted.  It is moderate in severity.         Past Medical History:  Diagnosis Date   Asthma     There are no problems to display for this patient.   No past surgical history on file.  Prior to Admission medications   Not on File    Allergies Amoxicillin, Cefdinir, Other, Peanut butter flavor, Peanut oil, Penicillin v, Apple, Peanut-containing drug products, and Penicillins  No family history on file.  Social History Social History   Tobacco Use   Smoking status: Never   Smokeless tobacco: Never  Substance Use Topics   Alcohol use: Yes    Review of Systems  Constitutional: No fever/chills Eyes: No visual changes. ENT: No sore throat. Cardiovascular: Denies chest pain. Respiratory: Denies shortness of breath. Gastrointestinal: abdominal pain.  nausea, no vomiting.  No diarrhea.  No constipation. Genitourinary: Negative for dysuria. Musculoskeletal: Negative for back pain. Skin: Negative for rash. Neurological: Negative for headaches, focal weakness   ____________________________________________   PHYSICAL EXAM:  VITAL SIGNS: ED Triage Vitals  Enc Vitals Group     BP 12/21/20 0040 124/72     Pulse Rate 12/21/20 0040 81     Resp 12/21/20 0040  16     Temp 12/21/20 0040 98.6 F (37 C)     Temp Source 12/21/20 0040 Oral     SpO2 12/21/20 0040 99 %     Weight 12/21/20 0036 200 lb (90.7 kg)     Height 12/21/20 0036 5\' 3"  (1.6 m)     Head Circumference --      Peak Flow --      Pain Score 12/21/20 0036 7     Pain Loc --      Pain Edu? --      Excl. in GC? --     Constitutional: Alert and oriented. Well appearing and in no acute distress. Eyes: Conjunctivae are normal.  Head: Atraumatic. Nose: No congestion/rhinnorhea. Mouth/Throat: Mucous membranes are moist.  Oropharynx non-erythematous. Neck: No stridor.   Cardiovascular: Normal rate, regular rhythm. Grossly normal heart sounds.  Good peripheral circulation. Respiratory: Normal respiratory effort.  No retractions. Lungs CTAB. Gastrointestinal: Soft tender to palpation percussion right lower quadrant no distention. No abdominal bruits.  Musculoskeletal: No lower extremity tenderness nor edema.   Neurologic:  Normal speech and language. No gross focal neurologic deficits are appreciated.  Skin:  Skin is warm, dry and intact. No rash noted.   ____________________________________________   LABS (all labs ordered are listed, but only abnormal results are displayed)  Labs Reviewed  COMPREHENSIVE METABOLIC PANEL - Abnormal; Notable for the following components:      Result Value   Potassium 3.4 (*)  Glucose, Bld 67 (*)    Albumin 3.4 (*)    Anion gap 3 (*)    All other components within normal limits  CBC - Abnormal; Notable for the following components:   WBC 20.5 (*)    All other components within normal limits  URINALYSIS, ROUTINE W REFLEX MICROSCOPIC - Abnormal; Notable for the following components:   Color, Urine STRAW (*)    APPearance CLEAR (*)    Specific Gravity, Urine 1.004 (*)    All other components within normal limits  CBC WITH DIFFERENTIAL/PLATELET - Abnormal; Notable for the following components:   WBC 20.5 (*)    Neutro Abs 14.1 (*)    Lymphs  Abs 4.5 (*)    Monocytes Absolute 1.6 (*)    Abs Immature Granulocytes 0.12 (*)    All other components within normal limits  CHLAMYDIA/NGC RT PCR ([facility] ONLY)            LIPASE, BLOOD   ____________________________________________  EKG   ____________________________________________  RADIOLOGY Jill Poling, personally viewed and evaluated these images (plain radiographs) as part of my medical decision making, as well as reviewing the written report by the radiologist.  ED MD interpretation: Ultrasound read by radiology ris negative chest x-ray read by radiology reviewed by me is negative  Official radiology report(s): DG Chest 2 View  Result Date: 12/21/2020 CLINICAL DATA:  Infection of unknown origin. EXAM: CHEST - 2 VIEW COMPARISON:  None. FINDINGS: The cardiac silhouette, mediastinal and hilar contours are within normal limits. The lungs are clear. No pleural effusions. The bony structures are intact. IMPRESSION: No acute cardiopulmonary findings. Electronically Signed   By: Rudie Meyer M.D.   On: 12/21/2020 10:29   MR PELVIS WO CONTRAST  Result Date: 12/21/2020 CLINICAL DATA:  Right-sided pelvic pain for 2 days. EXAM: MRI ABDOMEN AND PELVIS WITHOUT CONTRAST TECHNIQUE: Multiplanar multisequence MR imaging of the abdomen and pelvis was performed. No intravenous contrast was administered. COMPARISON:  None. FINDINGS: COMBINED FINDINGS FOR BOTH MR ABDOMEN AND PELVIS Lower chest: The lung bases are clear of an acute process. No pleural or pericardial effusion. Hepatobiliary: The liver is unremarkable. No hepatic lesions or intrahepatic biliary dilatation. The gallbladder is normal. No common bile duct dilatation. Pancreas:  No mass, inflammation or ductal dilatation. Spleen:  Normal size.  No focal lesions. Adrenals/Urinary Tract: Adrenal glands and kidneys are unremarkable. No hydronephrosis or findings suspicious for pyelonephritis. Stomach/Bowel: The stomach, duodenum, small  bowel and colon are grossly normal. No acute inflammatory process, mass lesion or obstructive findings. The appendix is not identified for certain but no findings suspicious for acute appendicitis. Vascular/Lymphatic: The aorta is normal in caliber. No abdominal or pelvic lymphadenopathy. Reproductive: Gravid uterus with grossly normal fetus. Normal appearing anterior placenta and normal amniotic fluid volume. A small myometrial fibroid is noted on the left side. Both ovaries are normal. Other:  No free abdominal or free pelvic fluid collections. Musculoskeletal: No significant bony findings. IMPRESSION: 1. The appendix is not identified for certain but no findings suspicious for acute appendicitis. 2. No acute abdominal or pelvic findings, mass lesion or adenopathy. 3. Gravid uterus with grossly normal fetus, normal placenta and normal amniotic fluid volume. Electronically Signed   By: Rudie Meyer M.D.   On: 12/21/2020 14:26   MR ABDOMEN WO CONTRAST  Result Date: 12/21/2020 CLINICAL DATA:  Right-sided pelvic pain for 2 days. EXAM: MRI ABDOMEN AND PELVIS WITHOUT CONTRAST TECHNIQUE: Multiplanar multisequence MR imaging of the abdomen and  pelvis was performed. No intravenous contrast was administered. COMPARISON:  None. FINDINGS: COMBINED FINDINGS FOR BOTH MR ABDOMEN AND PELVIS Lower chest: The lung bases are clear of an acute process. No pleural or pericardial effusion. Hepatobiliary: The liver is unremarkable. No hepatic lesions or intrahepatic biliary dilatation. The gallbladder is normal. No common bile duct dilatation. Pancreas:  No mass, inflammation or ductal dilatation. Spleen:  Normal size.  No focal lesions. Adrenals/Urinary Tract: Adrenal glands and kidneys are unremarkable. No hydronephrosis or findings suspicious for pyelonephritis. Stomach/Bowel: The stomach, duodenum, small bowel and colon are grossly normal. No acute inflammatory process, mass lesion or obstructive findings. The appendix is not  identified for certain but no findings suspicious for acute appendicitis. Vascular/Lymphatic: The aorta is normal in caliber. No abdominal or pelvic lymphadenopathy. Reproductive: Gravid uterus with grossly normal fetus. Normal appearing anterior placenta and normal amniotic fluid volume. A small myometrial fibroid is noted on the left side. Both ovaries are normal. Other:  No free abdominal or free pelvic fluid collections. Musculoskeletal: No significant bony findings. IMPRESSION: 1. The appendix is not identified for certain but no findings suspicious for acute appendicitis. 2. No acute abdominal or pelvic findings, mass lesion or adenopathy. 3. Gravid uterus with grossly normal fetus, normal placenta and normal amniotic fluid volume. Electronically Signed   By: Rudie Meyer M.D.   On: 12/21/2020 14:26   US OB Limited  Result Date: 12/21/2020 CLINICAL DATA:  Pregnant, abdominal pain. EXAM: LIMITED OBSTETRIC ULTRASOUND COMPARISON:  10/11/2020 FINDINGS: Number of Fetuses: 1 Heart Rate:  169 bpm Movement: Yes Presentation: Cephalic Placental Location: Anterior uterine body/lower uterine segment Previa: Complete Amniotic Fluid (Subjective):  Within normal limits. AFI:   Not specifically measured BPD: 3.1 cm 15 w  6 d MATERNAL FINDINGS: Cervix:  Appears closed.  No funneling.  Cervical length 4 cm Uterus/Adnexae: No uterine masses are identified. The maternal ovaries are not visualized. No free fluid within the pelvis. IMPRESSION: Single living intrauterine gestation with an estimated gestational age of 105 weeks, 6 days. Complete placenta previa. This exam is performed on an emergent basis and does not comprehensively evaluate fetal size, dating, or anatomy; follow-up complete OB US should be considered if further fetal assessment is warranted. Electronically Signed   By: Helyn Numbers M.D.   On: 12/21/2020 02:07    ____________________________________________   PROCEDURES  Procedure(s) performed  (including Critical Care):  Procedures   ____________________________________________   INITIAL IMPRESSION / ASSESSMENT AND PLAN / ED COURSE  Patient with right lower quadrant pain but normal MRI and essentially normal ultrasound.  Patient has however a 20,000 white count which was not present 2 months ago.  Very worried she has something going on which is serious.  I discussed her with Dr. Dalbert Garnet who is on-call for OB/GYN.  She will have her nurse practitioner come down further evaluate the patient.  I could be chorioamnionitis or possibly ovarian torsion although the patient does not seem to be in that much pain.  It could also be appendicitis that she is not showing up on the MRI.  There is no sign of intestinal obstruction or volvulus or anything similar.  I will likely have to sign this patient out to the oncoming physician.          ____________________________________________   FINAL CLINICAL IMPRESSION(S) / ED DIAGNOSES  Final diagnoses:  Right lower quadrant abdominal pain     ED Discharge Orders     None  Note:  This document was prepared using Dragon voice recognition software and may include unintentional dictation errors.    Arnaldo Natal, MD 12/21/20 [phone]

## 2020-12-21 NOTE — ED Triage Notes (Signed)
Pt to ED via POV with S.O. at bedside. Pt c/o Rt sided pelvic pain x2 days that make it painful to walk. 2nd pregnancy (first miscarriage)- pt has done prenatal care. Pt reports increased clear discharge and spotting. Pt reports lower abd pain, N/V. Pt A&Ox4

## 2020-12-21 NOTE — ED Notes (Signed)
Report given to Pattricia Boss, RN on mother baby unit. Per Pattricia Boss, pt may come up to room 349. Secretary to put in for transport.

## 2020-12-21 NOTE — H&P (Signed)
Consult History and Physical   SERVICE: Ob/Gyn  Patient Name: Chelsea Underwood Patient MRN:   [id number]  CC: RLQ pain   HPI: Chelsea Underwood is a 25 y.o. G2P0010 at [redacted]w[redacted]d with worsening RLQ that has increased over the last 2 days. Also have nausea that is a new symptom (pregnancy nausea had resolved). Pt states that she initially thought the pain was normal stretching and ligament pain, but the pain has continued to worsen, and is not resolved with tylenol, rest or hydration. She denies fever. Pain is worse in some positions, walking makes it worse.    Review of Systems: positives in bold GEN:   fevers, chills +  HEENT:  HA, vision changes, congestion GI:  abd pain+  Nausea+ V/D/C GU:  dysuria, urgency, frequency, NO VB, LOF. Reports active FM noted.  MSK:  arthralgias, myalgias, back pain, swelling SKIN:  rashes, color changes, pallor NEURO:  numbness, weakness, tingling, seizures, dizziness, tremors PSYCH:  depression, anxiety, behavioral problems, confusion  HEME/LYMPH:  easy bruising or bleeding ENDO:  heat/cold intolerance  Past Obstetrical History: OB History     Gravida  2   Para      Term      Preterm      AB  1   Living         SAB      IAB      Ectopic      Multiple      Live Births            Prenatal care started at [facility], initial visit on 12/02/20; EDD 06/08/21 based on early [place] and c/w LMP dating.   Pregnancy complicated by:  Asthma eye condition- swelling of retina (cleared by eye MD) hx mental health dx- no current meds, has regular counseling.  Obesity, BMI 36.9 refuses blood tx unless donated by family member  Past Gynecologic History: Patient's last menstrual period was 06/12/2020.   Past Medical History: Past Medical History:  Diagnosis Date   Asthma     Past Surgical History:  No past surgical history on file.  Family History:  family history is not on file.  Social History:  Social History    Socioeconomic History   Marital status: Single    Spouse name: Not on file   Number of children: Not on file   Years of education: Not on file   Highest education level: Not on file  Occupational History   Not on file  Tobacco Use   Smoking status: Never   Smokeless tobacco: Never  Substance and Sexual Activity   Alcohol use: Yes   Drug use: Not on file   Sexual activity: Yes  Other Topics Concern   Not on file  Social History Narrative   Not on file   Social Determinants of Health   Financial Resource Strain: Not on file  Food Insecurity: Not on file  Transportation Needs: Not on file  Physical Activity: Not on file  Stress: Not on file  Social Connections: Not on file  Intimate Partner Violence: Not on file    Home Medications:  Medications reconciled in EPIC  No current facility-administered medications on file prior to encounter.   Current Outpatient Medications on File Prior to Encounter  Medication Sig Dispense Refill   aspirin EC 81 MG tablet Take 81 mg by mouth daily. Swallow whole.     EPINEPHrine 0.3 mg/0.3 mL IJ SOAJ injection epinephrine 0.3 mg/0.3 mL injection, auto-injector  INJECT 0.3  ML (0.3 MG TOTAL) UNDER THE SKIN ONCE FOR 1 DOSE.     Prenatal 28-0.8 MG TABS Take 1 tablet by mouth daily.     albuterol (VENTOLIN HFA) 108 (90 Base) MCG/ACT inhaler Inhale 2 puffs into the lungs every 6 (six) hours as needed.      Allergies:  Allergies  Allergen Reactions   Amoxicillin Anaphylaxis and Itching   Cefdinir Anaphylaxis, Shortness Of Breath and Swelling    Other reaction(s): Facial Swelling, Wheezing   Other Anaphylaxis    Tree Nuts   Peanut Butter Flavor Anaphylaxis   Peanut Oil Anaphylaxis and Shortness Of Breath   Penicillin V Anaphylaxis   Apple Itching   Peanut-Containing Drug Products    Penicillins     Physical Exam:  Temp:  [97.9 F (36.6 C)-98.6 F (37 C)] 98.4 F (36.9 C) (10/29 1744) Pulse Rate:  [76-87] 79 (10/29 1744) Resp:   [14-20] 18 (10/29 1744) BP: (105-129)/(59-82) 129/82 (10/29 1744) SpO2:  [98 %-100 %] 100 % (10/29 1744) Weight:  [90.7 kg] 90.7 kg (10/29 0036)   General Appearance:  Well developed, well nourished, no acute distress, alert and oriented x3 HEENT:  Normocephalic atraumatic Abdomen:  Gravid,  soft, nondistended, no abnormal masses, no epigastric pain, RLQ pain, severe with light palpation. NO fundal tenderness, just pain radiated to RLQ with exam.  Extremities:  Full range of motion, no pedal edema, 2+ distal pulses, no tenderness Skin:  normal coloration and turgor, no rashes Psychiatric:  Normal mood and affect, appropriate, no AH/VH Pelvic:  NEFG, no vulvar masses or lesions, normal vaginal mucosa, no vaginal bleeding or discharge, cervix closed without lesions or erythema, gravid uterus, fundal height c/w GA   Labs/Studies:   CBC and Coags:  Lab Results  Component Value Date   WBC 16.2 (H) 12/21/2020   NEUTOPHILPCT 72 12/21/2020   EOSPCT 1 12/21/2020   BASOPCT 0 12/21/2020   LYMPHOPCT 20 12/21/2020   HGB 14.2 12/21/2020   HCT 42.3 12/21/2020   MCV 91.4 12/21/2020   PLT 226 12/21/2020   CMP:  Lab Results  Component Value Date   NA 135 12/21/2020   K 3.4 (L) 12/21/2020   CL 105 12/21/2020   CO2 27 12/21/2020   BUN 7 12/21/2020   CREATININE 0.52 12/21/2020   CREATININE 0.69 06/30/2020   PROT 6.7 12/21/2020   BILITOT 0.6 12/21/2020   ALT 30 12/21/2020   AST 23 12/21/2020   ALKPHOS 44 12/21/2020    TVUS:   Other Imaging: DG Chest 2 View  Result Date: 12/21/2020 CLINICAL DATA:  Infection of unknown origin. EXAM: CHEST - 2 VIEW COMPARISON:  None. FINDINGS: The cardiac silhouette, mediastinal and hilar contours are within normal limits. The lungs are clear. No pleural effusions. The bony structures are intact. IMPRESSION: No acute cardiopulmonary findings. Electronically Signed   By: Rudie Meyer M.D.   On: 12/21/2020 10:29   MR PELVIS WO CONTRAST  Result Date:  12/21/2020 CLINICAL DATA:  Right-sided pelvic pain for 2 days. EXAM: MRI ABDOMEN AND PELVIS WITHOUT CONTRAST TECHNIQUE: Multiplanar multisequence MR imaging of the abdomen and pelvis was performed. No intravenous contrast was administered. COMPARISON:  None. FINDINGS: COMBINED FINDINGS FOR BOTH MR ABDOMEN AND PELVIS Lower chest: The lung bases are clear of an acute process. No pleural or pericardial effusion. Hepatobiliary: The liver is unremarkable. No hepatic lesions or intrahepatic biliary dilatation. The gallbladder is normal. No common bile duct dilatation. Pancreas:  No mass, inflammation or ductal dilatation. Spleen:  Normal size.  No focal lesions. Adrenals/Urinary Tract: Adrenal glands and kidneys are unremarkable. No hydronephrosis or findings suspicious for pyelonephritis. Stomach/Bowel: The stomach, duodenum, small bowel and colon are grossly normal. No acute inflammatory process, mass lesion or obstructive findings. The appendix is not identified for certain but no findings suspicious for acute appendicitis. Vascular/Lymphatic: The aorta is normal in caliber. No abdominal or pelvic lymphadenopathy. Reproductive: Gravid uterus with grossly normal fetus. Normal appearing anterior placenta and normal amniotic fluid volume. A small myometrial fibroid is noted on the left side. Both ovaries are normal. Other:  No free abdominal or free pelvic fluid collections. Musculoskeletal: No significant bony findings. IMPRESSION: 1. The appendix is not identified for certain but no findings suspicious for acute appendicitis. 2. No acute abdominal or pelvic findings, mass lesion or adenopathy. 3. Gravid uterus with grossly normal fetus, normal placenta and normal amniotic fluid volume. Electronically Signed   By: Rudie Meyer M.D.   On: 12/21/2020 14:26   MR ABDOMEN WO CONTRAST  Result Date: 12/21/2020 CLINICAL DATA:  Right-sided pelvic pain for 2 days. EXAM: MRI ABDOMEN AND PELVIS WITHOUT CONTRAST TECHNIQUE:  Multiplanar multisequence MR imaging of the abdomen and pelvis was performed. No intravenous contrast was administered. COMPARISON:  None. FINDINGS: COMBINED FINDINGS FOR BOTH MR ABDOMEN AND PELVIS Lower chest: The lung bases are clear of an acute process. No pleural or pericardial effusion. Hepatobiliary: The liver is unremarkable. No hepatic lesions or intrahepatic biliary dilatation. The gallbladder is normal. No common bile duct dilatation. Pancreas:  No mass, inflammation or ductal dilatation. Spleen:  Normal size.  No focal lesions. Adrenals/Urinary Tract: Adrenal glands and kidneys are unremarkable. No hydronephrosis or findings suspicious for pyelonephritis. Stomach/Bowel: The stomach, duodenum, small bowel and colon are grossly normal. No acute inflammatory process, mass lesion or obstructive findings. The appendix is not identified for certain but no findings suspicious for acute appendicitis. Vascular/Lymphatic: The aorta is normal in caliber. No abdominal or pelvic lymphadenopathy. Reproductive: Gravid uterus with grossly normal fetus. Normal appearing anterior placenta and normal amniotic fluid volume. A small myometrial fibroid is noted on the left side. Both ovaries are normal. Other:  No free abdominal or free pelvic fluid collections. Musculoskeletal: No significant bony findings. IMPRESSION: 1. The appendix is not identified for certain but no findings suspicious for acute appendicitis. 2. No acute abdominal or pelvic findings, mass lesion or adenopathy. 3. Gravid uterus with grossly normal fetus, normal placenta and normal amniotic fluid volume. Electronically Signed   By: Rudie Meyer M.D.   On: 12/21/2020 14:26   US OB Limited  Result Date: 12/21/2020 CLINICAL DATA:  Pregnant, abdominal pain. EXAM: LIMITED OBSTETRIC ULTRASOUND COMPARISON:  10/11/2020 FINDINGS: Number of Fetuses: 1 Heart Rate:  169 bpm Movement: Yes Presentation: Cephalic Placental Location: Anterior uterine body/lower  uterine segment Previa: Complete Amniotic Fluid (Subjective):  Within normal limits. AFI:   Not specifically measured BPD: 3.1 cm 15 w  6 d MATERNAL FINDINGS: Cervix:  Appears closed.  No funneling.  Cervical length 4 cm Uterus/Adnexae: No uterine masses are identified. The maternal ovaries are not visualized. No free fluid within the pelvis. IMPRESSION: Single living intrauterine gestation with an estimated gestational age of [redacted] weeks, 6 days. Complete placenta previa. This exam is performed on an emergent basis and does not comprehensively evaluate fetal size, dating, or anatomy; follow-up complete OB US should be considered if further fetal assessment is warranted. Electronically Signed   By: Helyn Numbers M.D.   On: 12/21/2020 02:07  Assessment / Plan:   Nashea Underwood is a 25 y.o. G2P0010 at [redacted]w[redacted]d who presents with RLQ pain  - No acute Obstetric complication noted on exam - per [place], complete previa noted.  - no fundal tenderness, no e/o GU infection.   - Admitted to [facility] service per Dr Dalbert Garnet, with gen surgery consult.  - Merepenum 1gm IVPB q8hrs per Dr Dalbert Garnet.  - if pt is not improved tomorrow, surgery will proceed (per [facility] discussion with Dr Maia Plan)   Thank you for the opportunity to be involved with this pt's care.   Randa Ngo, CNM 12/21/2020 5:59 PM

## 2020-12-21 NOTE — Consult Note (Signed)
SURGICAL CONSULTATION NOTE   HISTORY OF PRESENT ILLNESS (HPI):  25 y.o. female presented to [facility] ED for evaluation of abdominal pain. Patient reports having abdominal pain since 2 days ago. Pain localized to the right lower quadrant.  Pain does not radiate to the part of body.  Pain is aggravated by palpation.  There has been alleviating factors.  Patient denies any fever.  Patient denies any vaginal bleeding.  Patient denies any vaginal secretions.  Patient with 15 weeks 6 days intrauterine pregnancy.  At the ED she was found with 20,000 white blood cell count.  MRI of the abdomen and pelvis shows no significant pathology.  The appendix was not visualized.  There is no inflammatory changes in the right lower quadrant.  I personally evaluated the images.  Surgery is consulted by Dr. Dalbert Garnet in this context for evaluation and management of rule out appendicitis.  PAST MEDICAL HISTORY (PMH):  Past Medical History:  Diagnosis Date   Asthma      PAST SURGICAL HISTORY (PSH):  No past surgical history on file.   MEDICATIONS:  Prior to Admission medications   Medication Sig Start Date End Date Taking? Authorizing Provider  aspirin EC 81 MG tablet Take 81 mg by mouth daily. Swallow whole.   Yes [provider]  EPINEPHrine 0.3 mg/0.3 mL IJ SOAJ injection epinephrine 0.3 mg/0.3 mL injection, auto-injector  INJECT 0.3 ML (0.3 MG TOTAL) UNDER THE SKIN ONCE FOR 1 DOSE. 02/05/17  Yes [provider]  Prenatal 28-0.8 MG TABS Take 1 tablet by mouth daily.   Yes [provider]  albuterol (VENTOLIN HFA) 108 (90 Base) MCG/ACT inhaler Inhale 2 puffs into the lungs every 6 (six) hours as needed. 08/22/15 07/15/21  [provider]     ALLERGIES:  Allergies  Allergen Reactions   Amoxicillin Anaphylaxis and Itching   Cefdinir Anaphylaxis, Shortness Of Breath and Swelling    Other reaction(s): Facial Swelling, Wheezing   Other Anaphylaxis    Tree Nuts   Peanut Butter  Flavor Anaphylaxis   Peanut Oil Anaphylaxis and Shortness Of Breath   Penicillin V Anaphylaxis   Apple Itching   Peanut-Containing Drug Products    Penicillins      SOCIAL HISTORY:  Social History   Socioeconomic History   Marital status: Single    Spouse name: Not on file   Number of children: Not on file   Years of education: Not on file   Highest education level: Not on file  Occupational History   Not on file  Tobacco Use   Smoking status: Never   Smokeless tobacco: Never  Substance and Sexual Activity   Alcohol use: Yes   Drug use: Not on file   Sexual activity: Yes  Other Topics Concern   Not on file  Social History Narrative   Not on file   Social Determinants of Health   Financial Resource Strain: Not on file  Food Insecurity: Not on file  Transportation Needs: Not on file  Physical Activity: Not on file  Stress: Not on file  Social Connections: Not on file  Intimate Partner Violence: Not on file      FAMILY HISTORY:  No family history on file.   REVIEW OF SYSTEMS:  Constitutional: denies weight loss, fever, chills, or sweats  Eyes: denies any other vision changes, history of eye injury  ENT: denies sore throat, hearing problems  Respiratory: denies shortness of breath, wheezing  Cardiovascular: denies chest pain, palpitations  Gastrointestinal: abdominal pain,  nausea Genitourinary: denies burning with urination or urinary frequency Musculoskeletal: denies any other joint pains or cramps  Skin: denies any other rashes or skin discolorations  Neurological: denies any other headache, dizziness, weakness  Psychiatric: denies any other depression, anxiety   All other review of systems were negative   VITAL SIGNS:  Temp:  [97.9 F (36.6 C)-98.6 F (37 C)] 98.4 F (36.9 C) (10/29 1744) Pulse Rate:  [76-87] 79 (10/29 1744) Resp:  [14-20] 18 (10/29 1744) BP: (105-129)/(59-82) 129/82 (10/29 1744) SpO2:  [98 %-100 %] 100 % (10/29 1744) Weight:   [90.7 kg] 90.7 kg (10/29 0036)     Height: 5\' 3"  (160 cm) Weight: 90.7 kg BMI (Calculated): 35.44   INTAKE/OUTPUT:  This shift: Total I/O In: 56.7 [I.V.:1.6; IV Piggyback:55.1] Out: -   Last 2 shifts: @IOLAST2SHIFTS @   PHYSICAL EXAM:  Constitutional:  -- Normal body habitus  -- Awake, alert, and oriented x3  Eyes:  -- Pupils equally round and reactive to light  -- No scleral icterus  Ear, nose, and throat:  -- No jugular venous distension  Pulmonary:  -- No crackles  -- Equal breath sounds bilaterally -- Breathing non-labored at rest Cardiovascular:  -- S1, S2 present  -- No pericardial rubs Gastrointestinal:  -- Abdomen soft, tender to palpation in the right lower quadrant, non-distended, no guarding or rebound tenderness -- No abdominal masses appreciated, pulsatile or otherwise  Musculoskeletal and Integumentary:  -- Wounds: None appreciated -- Extremities: B/L UE and LE FROM, hands and feet warm, no edema  Neurologic:  -- Motor function: intact and symmetric -- Sensation: intact and symmetric   Labs:  CBC Latest Ref Rng & Units 12/21/2020 12/21/2020 12/21/2020  WBC 4.0 - 10.5 K/uL 16.2(H) 20.5(H) 20.5(H)  Hemoglobin 12.0 - 15.0 g/dL 12/23/2020 12/23/2020 24.0  Hematocrit 36.0 - 46.0 % 42.3 38.0 39.3  Platelets 150 - 400 K/uL 226 267 282   CMP Latest Ref Rng & Units 12/21/2020 06/30/2020  Glucose 70 - 99 mg/dL 12/23/2020) 93  BUN 6 - 20 mg/dL 7 10  Creatinine 08/30/2020 - 1.00 mg/dL 99(M 4.26  Sodium 8.34 - 145 mmol/L 135 138  Potassium 3.5 - 5.1 mmol/L 3.4(L) 3.2(L)  Chloride 98 - 111 mmol/L 105 106  CO2 22 - 32 mmol/L 27 23  Calcium 8.9 - 10.3 mg/dL 9.0 9.1  Total Protein 6.5 - 8.1 g/dL 6.7 -  Total Bilirubin 0.3 - 1.2 mg/dL 0.6 -  Alkaline Phos 38 - 126 U/L 44 -  AST 15 - 41 U/L 23 -  ALT 0 - 44 U/L 30 -    Imaging studies:  EXAM: MRI ABDOMEN AND PELVIS WITHOUT CONTRAST   TECHNIQUE: Multiplanar multisequence MR imaging of the abdomen and pelvis was performed. No intravenous  contrast was administered.   COMPARISON:  None.   FINDINGS: COMBINED FINDINGS FOR BOTH MR ABDOMEN AND PELVIS   Lower chest: The lung bases are clear of an acute process. No pleural or pericardial effusion.   Hepatobiliary: The liver is unremarkable. No hepatic lesions or intrahepatic biliary dilatation. The gallbladder is normal. No common bile duct dilatation.   Pancreas:  No mass, inflammation or ductal dilatation.   Spleen:  Normal size.  No focal lesions.   Adrenals/Urinary Tract: Adrenal glands and kidneys are unremarkable. No hydronephrosis or findings suspicious for pyelonephritis.   Stomach/Bowel: The stomach, duodenum, small bowel and colon are grossly normal. No acute inflammatory process, mass lesion or obstructive findings. The appendix is not identified for certain  but no findings suspicious for acute appendicitis.   Vascular/Lymphatic: The aorta is normal in caliber. No abdominal or pelvic lymphadenopathy.   Reproductive: Gravid uterus with grossly normal fetus. Normal appearing anterior placenta and normal amniotic fluid volume. A small myometrial fibroid is noted on the left side. Both ovaries are normal.   Other:  No free abdominal or free pelvic fluid collections.   Musculoskeletal: No significant bony findings.   IMPRESSION: 1. The appendix is not identified for certain but no findings suspicious for acute appendicitis. 2. No acute abdominal or pelvic findings, mass lesion or adenopathy. 3. Gravid uterus with grossly normal fetus, normal placenta and normal amniotic fluid volume.     Electronically Signed   By: Rudie Meyer M.D.   On: 12/21/2020 14:26  Assessment/Plan:  25 y.o. female 15-week pregnancy with right lower quadrant pain.  Clinical exam with localized pain in the right upper quadrant.  MRI with nonvisualization of the appendix but no findings suspicious for acute appendicitis.  I will expect with 20,000 normal cell count to have an  inflammatory process around the appendix or cecum if this was appendicitis.  Anyway since the appendix was not visualized on the MRI appendicitis cannot completely be ruled out.  I discussed with the patient the option of diagnostic laparoscopy versus observation with antibiotic therapy.  Patient endorses that she would prefer to avoid any surgery if possible.  I discussed with the patient that I can observe her with clinical exams for the next 12 hours and if there is no improvement I would recommend to proceed with diagnostic laparoscopy.    Repeated white cell count decreased to 16,000 with just hydration.  First dose of antibiotic was given on 6:22 PM.  My initial evaluation at 4:30 PM with a lower current pain.  I perform a follow-up exam at 6:30 PM and patient with persistent right lower current pain.  My plan will be to evaluate patient in the morning and if the pain persists I will discuss with her diagnostic laparoscopy.  I oriented the patient and the nurse that if there is any worsening pain during the night to contact me to discuss about surgery as well.  Plan also discussed with Dr. Dalbert Garnet.  Patient reports she understood and agreed with plan.  Gae Gallop, MD

## 2020-12-21 NOTE — ED Notes (Signed)
RN aware of bed assignment 

## 2020-12-22 ENCOUNTER — Encounter
Admission: EM | Disposition: A | Discharge status: Home / Self Care | Payer: Self-pay | Source: Home / Self Care | Attending: Obstetrics and Gynecology

## 2020-12-22 ENCOUNTER — Inpatient Hospital Stay: Payer: BC Managed Care – PPO | Admitting: Anesthesiology

## 2020-12-22 HISTORY — PX: XI ROBOTIC LAPAROSCOPIC ASSISTED APPENDECTOMY: SHX6877

## 2020-12-22 LAB — CBC
HCT: 35.9 % — ABNORMAL LOW (ref 36.0–46.0)
Hemoglobin: 11.9 g/dL — ABNORMAL LOW (ref 12.0–15.0)
MCH: 30.6 pg (ref 26.0–34.0)
MCHC: 33.1 g/dL (ref 30.0–36.0)
MCV: 92.3 fL (ref 80.0–100.0)
Platelets: 257 10*3/uL (ref 150–400)
RBC: 3.89 MIL/uL (ref 3.87–5.11)
RDW: 13.3 % (ref 11.5–15.5)
WBC: 12.2 10*3/uL — ABNORMAL HIGH (ref 4.0–10.5)
nRBC: 0 % (ref 0.0–0.2)

## 2020-12-22 LAB — RESP PANEL BY RT-PCR (FLU A&B, COVID) ARPGX2
Influenza A by PCR: NEGATIVE
Influenza B by PCR: NEGATIVE
SARS Coronavirus 2 by RT PCR: NEGATIVE

## 2020-12-22 SURGERY — APPENDECTOMY, ROBOT-ASSISTED, LAPAROSCOPIC
Anesthesia: General

## 2020-12-22 MED ORDER — DEXAMETHASONE SODIUM PHOSPHATE 10 MG/ML IJ SOLN
INTRAMUSCULAR | Status: DC | PRN
Start: 2020-12-22 — End: 2020-12-22
  Administered 2020-12-22: 10 mg via INTRAVENOUS

## 2020-12-22 MED ORDER — GLYCOPYRROLATE 0.2 MG/ML IJ SOLN
INTRAMUSCULAR | Status: AC
  Filled 2020-12-22: qty 3

## 2020-12-22 MED ORDER — PROPOFOL 10 MG/ML IV BOLUS
INTRAVENOUS | Status: DC | PRN
  Administered 2020-12-22: 150 mg via INTRAVENOUS

## 2020-12-22 MED ORDER — LIDOCAINE HCL (PF) 2 % IJ SOLN
INTRAMUSCULAR | Status: AC
  Filled 2020-12-22: qty 5

## 2020-12-22 MED ORDER — FENTANYL CITRATE (PF) 100 MCG/2ML IJ SOLN
25.0000 ug | INTRAMUSCULAR | Status: DC | PRN
  Administered 2020-12-22: 25 ug via INTRAVENOUS
  Administered 2020-12-22 (×2): 50 ug via INTRAVENOUS

## 2020-12-22 MED ORDER — PHENYLEPHRINE HCL (PRESSORS) 10 MG/ML IV SOLN
INTRAVENOUS | Status: DC | PRN
  Administered 2020-12-22: 80 ug via INTRAVENOUS
  Administered 2020-12-22: 160 ug via INTRAVENOUS

## 2020-12-22 MED ORDER — GLYCOPYRROLATE 0.2 MG/ML IJ SOLN
INTRAMUSCULAR | Status: DC | PRN
  Administered 2020-12-22: .6 mg via INTRAVENOUS

## 2020-12-22 MED ORDER — OXYCODONE HCL 5 MG PO TABS
5.0000 mg | ORAL_TABLET | Freq: Four times a day (QID) | ORAL | Status: DC | PRN
  Administered 2020-12-22 (×2): 10 mg via ORAL
  Administered 2020-12-23: 5 mg via ORAL
  Filled 2020-12-22 (×3): qty 2
  Filled 2020-12-22: qty 1

## 2020-12-22 MED ORDER — LACTATED RINGERS IV SOLN: INTRAVENOUS | Status: DC

## 2020-12-22 MED ORDER — INFLUENZA VAC SPLIT QUAD 0.5 ML IM SUSY
0.5000 mL | PREFILLED_SYRINGE | INTRAMUSCULAR | Status: DC
  Filled 2020-12-22: qty 0.5

## 2020-12-22 MED ORDER — BUPIVACAINE-EPINEPHRINE (PF) 0.5% -1:200000 IJ SOLN
INTRAMUSCULAR | Status: DC | PRN
  Administered 2020-12-22 (×2): 10 mL

## 2020-12-22 MED ORDER — 0.9 % SODIUM CHLORIDE (POUR BTL) OPTIME
TOPICAL | Status: DC | PRN
  Administered 2020-12-22: 200 mL

## 2020-12-22 MED ORDER — FENTANYL CITRATE (PF) 100 MCG/2ML IJ SOLN
INTRAMUSCULAR | Status: AC
  Administered 2020-12-22: 25 ug via INTRAVENOUS
  Filled 2020-12-22: qty 2

## 2020-12-22 MED ORDER — ACETAMINOPHEN 500 MG PO TABS
1000.0000 mg | ORAL_TABLET | Freq: Three times a day (TID) | ORAL | Status: DC
  Administered 2020-12-22 – 2020-12-23 (×3): 1000 mg via ORAL
  Filled 2020-12-22 (×3): qty 2

## 2020-12-22 MED ORDER — MEPERIDINE HCL 25 MG/ML IJ SOLN: 6.2500 mg | INTRAMUSCULAR | Status: DC | PRN

## 2020-12-22 MED ORDER — FENTANYL CITRATE (PF) 100 MCG/2ML IJ SOLN
INTRAMUSCULAR | Status: DC | PRN
  Administered 2020-12-22 (×2): 50 ug via INTRAVENOUS

## 2020-12-22 MED ORDER — NEOSTIGMINE METHYLSULFATE 10 MG/10ML IV SOLN
INTRAVENOUS | Status: AC
  Filled 2020-12-22: qty 1

## 2020-12-22 MED ORDER — ONDANSETRON HCL 4 MG/2ML IJ SOLN: 4.0000 mg | Freq: Once | INTRAMUSCULAR | Status: DC | PRN

## 2020-12-22 MED ORDER — ACETAMINOPHEN 10 MG/ML IV SOLN
INTRAVENOUS | Status: AC
  Filled 2020-12-22: qty 100

## 2020-12-22 MED ORDER — ACETAMINOPHEN 500 MG PO TABS: 1000.0000 mg | ORAL_TABLET | Freq: Three times a day (TID) | ORAL | Status: DC | PRN

## 2020-12-22 MED ORDER — FENTANYL CITRATE (PF) 100 MCG/2ML IJ SOLN
INTRAMUSCULAR | Status: AC
  Filled 2020-12-22: qty 2

## 2020-12-22 MED ORDER — MORPHINE SULFATE (PF) 4 MG/ML IV SOLN
4.0000 mg | INTRAVENOUS | Status: DC | PRN
  Administered 2020-12-22 (×2): 4 mg via INTRAVENOUS
  Filled 2020-12-22 (×2): qty 1

## 2020-12-22 MED ORDER — PROPOFOL 10 MG/ML IV BOLUS
INTRAVENOUS | Status: AC
  Filled 2020-12-22: qty 20

## 2020-12-22 MED ORDER — PHENYLEPHRINE HCL-NACL 20-0.9 MG/250ML-% IV SOLN
INTRAVENOUS | Status: AC
  Filled 2020-12-22: qty 250

## 2020-12-22 MED ORDER — ONDANSETRON HCL 4 MG/2ML IJ SOLN
INTRAMUSCULAR | Status: DC | PRN
Start: 2020-12-22 — End: 2020-12-22
  Administered 2020-12-22: 4 mg via INTRAVENOUS

## 2020-12-22 MED ORDER — ACETAMINOPHEN 10 MG/ML IV SOLN
INTRAVENOUS | Status: DC | PRN
  Administered 2020-12-22: 1000 mg via INTRAVENOUS

## 2020-12-22 MED ORDER — MIDAZOLAM HCL 2 MG/2ML IJ SOLN
INTRAMUSCULAR | Status: AC
  Filled 2020-12-22: qty 2

## 2020-12-22 MED ORDER — SUCCINYLCHOLINE CHLORIDE 200 MG/10ML IV SOSY
PREFILLED_SYRINGE | INTRAVENOUS | Status: AC
  Filled 2020-12-22: qty 10

## 2020-12-22 MED ORDER — HYDROCODONE-ACETAMINOPHEN 7.5-325 MG PO TABS
ORAL_TABLET | ORAL | Status: AC
  Administered 2020-12-22: 1 via ORAL
  Filled 2020-12-22: qty 1

## 2020-12-22 MED ORDER — NEOSTIGMINE METHYLSULFATE 10 MG/10ML IV SOLN
INTRAVENOUS | Status: DC | PRN
  Administered 2020-12-22: 4 mg via INTRAVENOUS

## 2020-12-22 MED ORDER — HYDROCODONE-ACETAMINOPHEN 7.5-325 MG PO TABS: 1.0000 | ORAL_TABLET | Freq: Once | ORAL | Status: AC | PRN

## 2020-12-22 MED ORDER — PROPOFOL 500 MG/50ML IV EMUL
INTRAVENOUS | Status: AC
  Filled 2020-12-22: qty 50

## 2020-12-22 MED ORDER — ROCURONIUM BROMIDE 100 MG/10ML IV SOLN
INTRAVENOUS | Status: DC | PRN
Start: 2020-12-22 — End: 2020-12-22
  Administered 2020-12-22: 5 mg via INTRAVENOUS
  Administered 2020-12-22: 45 mg via INTRAVENOUS

## 2020-12-22 MED ORDER — LIDOCAINE HCL (CARDIAC) PF 100 MG/5ML IV SOSY
PREFILLED_SYRINGE | INTRAVENOUS | Status: DC | PRN
  Administered 2020-12-22: 60 mg via INTRAVENOUS

## 2020-12-22 MED ORDER — ROCURONIUM BROMIDE 10 MG/ML (PF) SYRINGE
PREFILLED_SYRINGE | INTRAVENOUS | Status: AC
  Filled 2020-12-22: qty 10

## 2020-12-22 MED ORDER — PROPOFOL 500 MG/50ML IV EMUL
INTRAVENOUS | Status: DC | PRN
  Administered 2020-12-22: 75 ug/kg/min via INTRAVENOUS

## 2020-12-22 SURGICAL SUPPLY — 62 items
BAG INFUSER PRESSURE 100CC (MISCELLANEOUS) IMPLANT
BLADE SURG SZ11 CARB STEEL (BLADE) ×2 IMPLANT
CANNULA REDUC XI 12-8 STAPL (CANNULA) ×1
CANNULA REDUCER 12-8 DVNC XI (CANNULA) ×1 IMPLANT
CHLORAPREP W/TINT 26 (MISCELLANEOUS) ×2 IMPLANT
COVER TIP SHEARS 8 DVNC (MISCELLANEOUS) ×1 IMPLANT
COVER TIP SHEARS 8MM DA VINCI (MISCELLANEOUS) ×1
DEFOGGER SCOPE WARMER CLEARIFY (MISCELLANEOUS) ×2 IMPLANT
DERMABOND ADVANCED (GAUZE/BANDAGES/DRESSINGS) ×1
DERMABOND ADVANCED .7 DNX12 (GAUZE/BANDAGES/DRESSINGS) ×1 IMPLANT
DRAPE ARM DVNC X/XI (DISPOSABLE) ×4 IMPLANT
DRAPE COLUMN DVNC XI (DISPOSABLE) ×1 IMPLANT
DRAPE DA VINCI XI ARM (DISPOSABLE) ×4
DRAPE DA VINCI XI COLUMN (DISPOSABLE) ×1
ELECT REM PT RETURN 9FT ADLT (ELECTROSURGICAL) ×2
ELECTRODE REM PT RTRN 9FT ADLT (ELECTROSURGICAL) ×1 IMPLANT
GAUZE 4X4 16PLY ~~LOC~~+RFID DBL (SPONGE) ×2 IMPLANT
GLOVE SURG SYN 6.5 ES PF (GLOVE) ×4 IMPLANT
GLOVE SURG UNDER POLY LF SZ6.5 (GLOVE) ×4 IMPLANT
GOWN STRL REUS W/ TWL LRG LVL3 (GOWN DISPOSABLE) ×3 IMPLANT
GOWN STRL REUS W/TWL LRG LVL3 (GOWN DISPOSABLE) ×3
GRASPER SUT TROCAR 14GX15 (MISCELLANEOUS) IMPLANT
IRRIGATOR SUCT 8 DISP DVNC XI (IRRIGATION / IRRIGATOR) IMPLANT
IRRIGATOR SUCTION 8MM XI DISP (IRRIGATION / IRRIGATOR)
IV NS 1000ML (IV SOLUTION)
IV NS 1000ML BAXH (IV SOLUTION) IMPLANT
KIT PINK PAD W/HEAD ARE REST (MISCELLANEOUS) ×2 IMPLANT
KIT PINK PAD W/HEAD ARM REST (MISCELLANEOUS) ×1 IMPLANT
LABEL OR SOLS (LABEL) IMPLANT
MANIFOLD NEPTUNE II (INSTRUMENTS) ×2 IMPLANT
NEEDLE HYPO 22GX1.5 SAFETY (NEEDLE) ×2 IMPLANT
NEEDLE INSUFFLATION 14GA 120MM (NEEDLE) ×2 IMPLANT
OBTURATOR OPTICAL STANDARD 8MM (TROCAR) ×1
OBTURATOR OPTICAL STND 8 DVNC (TROCAR) ×1
OBTURATOR OPTICALSTD 8 DVNC (TROCAR) ×1 IMPLANT
PACK LAP CHOLECYSTECTOMY (MISCELLANEOUS) ×2 IMPLANT
POUCH SPECIMEN RETRIEVAL 10MM (ENDOMECHANICALS) ×2 IMPLANT
RELOAD STAPLER 2.5X45 WHT DVNC (STAPLE) IMPLANT
RELOAD STAPLER 3.5X45 BLU DVNC (STAPLE) IMPLANT
SEAL CANN UNIV 5-8 DVNC XI (MISCELLANEOUS) ×3 IMPLANT
SEAL XI 5MM-8MM UNIVERSAL (MISCELLANEOUS) ×3
SEALER VESSEL DA VINCI XI (MISCELLANEOUS)
SEALER VESSEL EXT DVNC XI (MISCELLANEOUS) IMPLANT
SET TUBE SMOKE EVAC HIGH FLOW (TUBING) ×2 IMPLANT
SOLUTION ELECTROLUBE (MISCELLANEOUS) ×2 IMPLANT
SPONGE T-LAP 4X18 ~~LOC~~+RFID (SPONGE) IMPLANT
STAPLER 45 DA VINCI SURE FORM (STAPLE)
STAPLER 45 SUREFORM DVNC (STAPLE) IMPLANT
STAPLER CANNULA SEAL DVNC XI (STAPLE) ×1 IMPLANT
STAPLER CANNULA SEAL XI (STAPLE) ×1
STAPLER RELOAD 2.5X45 WHITE (STAPLE)
STAPLER RELOAD 2.5X45 WHT DVNC (STAPLE)
STAPLER RELOAD 3.5X45 BLU DVNC (STAPLE)
STAPLER RELOAD 3.5X45 BLUE (STAPLE)
SUT MNCRL AB 4-0 PS2 18 (SUTURE) ×2 IMPLANT
SUT VIC AB 2-0 SH 27 (SUTURE) ×1
SUT VIC AB 2-0 SH 27XBRD (SUTURE) ×1 IMPLANT
SUT VICRYL 0 AB UR-6 (SUTURE) ×2 IMPLANT
SUT VLOC 90 6 CV-15 VIOLET (SUTURE) ×2 IMPLANT
SYR 30ML LL (SYRINGE) ×2 IMPLANT
TRAY FOLEY MTR SLVR 16FR STAT (SET/KITS/TRAYS/PACK) ×2 IMPLANT
WATER STERILE IRR 500ML POUR (IV SOLUTION) ×2 IMPLANT

## 2020-12-22 NOTE — Progress Notes (Signed)
Obstetric and Gynecology  Subjective  Chelsea Underwood is a 25 y.o. female G2P0010 at now [redacted]w[redacted]d who presented on 12/21/2020 for several days of acute RLQ pain. Imaging inconclusive for appendicitis but not visualized on the MRI and cannot completely be ruled out. No edema or evidence of inflammatory process. Elevated WBC, no fever, +nausea.  16wk pregnancy, complicated by obesity, mild asthma not on chronic meds, and declining blood transfusion.  GC/CT negative on admission. No s/s of septic abortion. Normal ultrasound   Objective   Vitals:   12/21/20 1949 12/22/20 0156  BP: 104/64 98/64  Pulse: 98 78  Resp: 14 16  Temp: 98.4 F (36.9 C) 98.2 F (36.8 C)  SpO2: 100% 100%     Intake/Output Summary (Last 24 hours) at 12/22/2020 0753 Last data filed at 12/22/2020 0159 Gross per 24 hour  Intake 56.73 ml  Output 800 ml  Net -743.27 ml    General: NAD Cardiovascular: RRR, no murmurs Pulmonary: CTAB Abdomen: Benign. Non-tender, +BS, + voluntary guarding, +TTP in RLQ but Rovsings neg Obstetric: Fundus 4cm below umbilicus, non tender to palpation  Labs: Results for orders placed or performed during the hospital encounter of 12/21/20 (from the past 24 hour(s))  CBC with Differential     Status: Abnormal   Collection Time: 12/21/20  4:02 PM  Result Value Ref Range   WBC 16.2 (H) 4.0 - 10.5 K/uL   RBC 4.63 3.87 - 5.11 MIL/uL   Hemoglobin 14.2 12.0 - 15.0 g/dL   HCT 32.9 51.8 - 84.1 %   MCV 91.4 80.0 - 100.0 fL   MCH 30.7 26.0 - 34.0 pg   MCHC 33.6 30.0 - 36.0 g/dL   RDW 66.0 63.0 - 16.0 %   Platelets 226 150 - 400 K/uL   nRBC 0.0 0.0 - 0.2 %   Neutrophils Relative % 72 %   Neutro Abs 11.7 (H) 1.7 - 7.7 K/uL   Lymphocytes Relative 20 %   Lymphs Abs 3.3 0.7 - 4.0 K/uL   Monocytes Relative 6 %   Monocytes Absolute 1.0 0.1 - 1.0 K/uL   Eosinophils Relative 1 %   Eosinophils Absolute 0.1 0.0 - 0.5 K/uL   Basophils Relative 0 %   Basophils Absolute 0.0 0.0 - 0.1 K/uL    Immature Granulocytes 1 %   Abs Immature Granulocytes 0.09 (H) 0.00 - 0.07 K/uL  CBC     Status: Abnormal   Collection Time: 12/22/20  5:05 AM  Result Value Ref Range   WBC 12.2 (H) 4.0 - 10.5 K/uL   RBC 3.89 3.87 - 5.11 MIL/uL   Hemoglobin 11.9 (L) 12.0 - 15.0 g/dL   HCT 10.9 (L) 32.3 - 55.7 %   MCV 92.3 80.0 - 100.0 fL   MCH 30.6 26.0 - 34.0 pg   MCHC 33.1 30.0 - 36.0 g/dL   RDW 32.2 02.5 - 42.7 %   Platelets 257 150 - 400 K/uL   nRBC 0.0 0.0 - 0.2 %    Cultures: Results for orders placed or performed during the hospital encounter of 12/21/20  Chlamydia/NGC rt PCR ([facility] only)     Status: None   Collection Time: 12/21/20 12:37 AM   Specimen: Urine  Result Value Ref Range Status   Specimen source GC/Chlam URINE, RANDOM  Final   Chlamydia Tr NOT DETECTED NOT DETECTED Final   N gonorrhoeae NOT DETECTED NOT DETECTED Final    Comment: (NOTE) This CT/NG assay has not been evaluated in patients with a history  of  hysterectomy. Performed at [facility] [address], [place], [place] [address]     Imaging: DG Chest 2 View  Result Date: 12/21/2020 CLINICAL DATA:  Infection of unknown origin. EXAM: CHEST - 2 VIEW COMPARISON:  None. FINDINGS: The cardiac silhouette, mediastinal and hilar contours are within normal limits. The lungs are clear. No pleural effusions. The bony structures are intact. IMPRESSION: No acute cardiopulmonary findings. Electronically Signed   By: Rudie Meyer M.D.   On: 12/21/2020 10:29   MR PELVIS WO CONTRAST  Result Date: 12/21/2020 CLINICAL DATA:  Right-sided pelvic pain for 2 days. EXAM: MRI ABDOMEN AND PELVIS WITHOUT CONTRAST TECHNIQUE: Multiplanar multisequence MR imaging of the abdomen and pelvis was performed. No intravenous contrast was administered. COMPARISON:  None. FINDINGS: COMBINED FINDINGS FOR BOTH MR ABDOMEN AND PELVIS Lower chest: The lung bases are clear of an acute process. No pleural or pericardial effusion. Hepatobiliary: The  liver is unremarkable. No hepatic lesions or intrahepatic biliary dilatation. The gallbladder is normal. No common bile duct dilatation. Pancreas:  No mass, inflammation or ductal dilatation. Spleen:  Normal size.  No focal lesions. Adrenals/Urinary Tract: Adrenal glands and kidneys are unremarkable. No hydronephrosis or findings suspicious for pyelonephritis. Stomach/Bowel: The stomach, duodenum, small bowel and colon are grossly normal. No acute inflammatory process, mass lesion or obstructive findings. The appendix is not identified for certain but no findings suspicious for acute appendicitis. Vascular/Lymphatic: The aorta is normal in caliber. No abdominal or pelvic lymphadenopathy. Reproductive: Gravid uterus with grossly normal fetus. Normal appearing anterior placenta and normal amniotic fluid volume. A small myometrial fibroid is noted on the left side. Both ovaries are normal. Other:  No free abdominal or free pelvic fluid collections. Musculoskeletal: No significant bony findings. IMPRESSION: 1. The appendix is not identified for certain but no findings suspicious for acute appendicitis. 2. No acute abdominal or pelvic findings, mass lesion or adenopathy. 3. Gravid uterus with grossly normal fetus, normal placenta and normal amniotic fluid volume. Electronically Signed   By: Rudie Meyer M.D.   On: 12/21/2020 14:26   MR ABDOMEN WO CONTRAST  Result Date: 12/21/2020 CLINICAL DATA:  Right-sided pelvic pain for 2 days. EXAM: MRI ABDOMEN AND PELVIS WITHOUT CONTRAST TECHNIQUE: Multiplanar multisequence MR imaging of the abdomen and pelvis was performed. No intravenous contrast was administered. COMPARISON:  None. FINDINGS: COMBINED FINDINGS FOR BOTH MR ABDOMEN AND PELVIS Lower chest: The lung bases are clear of an acute process. No pleural or pericardial effusion. Hepatobiliary: The liver is unremarkable. No hepatic lesions or intrahepatic biliary dilatation. The gallbladder is normal. No common bile duct  dilatation. Pancreas:  No mass, inflammation or ductal dilatation. Spleen:  Normal size.  No focal lesions. Adrenals/Urinary Tract: Adrenal glands and kidneys are unremarkable. No hydronephrosis or findings suspicious for pyelonephritis. Stomach/Bowel: The stomach, duodenum, small bowel and colon are grossly normal. No acute inflammatory process, mass lesion or obstructive findings. The appendix is not identified for certain but no findings suspicious for acute appendicitis. Vascular/Lymphatic: The aorta is normal in caliber. No abdominal or pelvic lymphadenopathy. Reproductive: Gravid uterus with grossly normal fetus. Normal appearing anterior placenta and normal amniotic fluid volume. A small myometrial fibroid is noted on the left side. Both ovaries are normal. Other:  No free abdominal or free pelvic fluid collections. Musculoskeletal: No significant bony findings. IMPRESSION: 1. The appendix is not identified for certain but no findings suspicious for acute appendicitis. 2. No acute abdominal or pelvic findings, mass lesion or adenopathy. 3. Gravid  uterus with grossly normal fetus, normal placenta and normal amniotic fluid volume. Electronically Signed   By: Rudie Meyer M.D.   On: 12/21/2020 14:26   US OB Limited  Result Date: 12/21/2020 CLINICAL DATA:  Pregnant, abdominal pain. EXAM: LIMITED OBSTETRIC ULTRASOUND COMPARISON:  10/11/2020 FINDINGS: Number of Fetuses: 1 Heart Rate:  169 bpm Movement: Yes Presentation: Cephalic Placental Location: Anterior uterine body/lower uterine segment Previa: Complete Amniotic Fluid (Subjective):  Within normal limits. AFI:   Not specifically measured BPD: 3.1 cm 15 w  6 d MATERNAL FINDINGS: Cervix:  Appears closed.  No funneling.  Cervical length 4 cm Uterus/Adnexae: No uterine masses are identified. The maternal ovaries are not visualized. No free fluid within the pelvis. IMPRESSION: Single living intrauterine gestation with an estimated gestational age of [redacted] weeks,  6 days. Complete placenta previa. This exam is performed on an emergent basis and does not comprehensively evaluate fetal size, dating, or anatomy; follow-up complete OB US should be considered if further fetal assessment is warranted. Electronically Signed   By: Helyn Numbers M.D.   On: 12/21/2020 02:07     Assessment   25 y.o. G2P0010 Hospital Day: 2 with RLQ pain, not significantly improved since admission.  Plan   1. RLQ pain: although imaging did not visualize appendix, does not appear to be obstetric or other process to explain her localized pain. After discussionw with patient with Dr. Hazle Quant, using joint decision making, we have determined to proceed with dx lap and appendectomy. Risks of finding neg appendicitis discussed, and risks of sending her home and missing it also discussed. If no findings of appendicitis, will explore pelvis.  2. OB care: confirm fetal heart tones prior to and after surgery.

## 2020-12-22 NOTE — Anesthesia Procedure Notes (Signed)
Procedure Name: Intubation Date/Time: 12/22/2020 9:15 AM Performed by: Jonna Clark, CRNA Pre-anesthesia Checklist: Patient identified, Patient being monitored, Timeout performed, Emergency Drugs available and Suction available Patient Re-evaluated:Patient Re-evaluated prior to induction Oxygen Delivery Method: Circle system utilized Preoxygenation: Pre-oxygenation with 100% oxygen Induction Type: IV induction Ventilation: Mask ventilation without difficulty Laryngoscope Size: Mac and 3 Grade View: Grade I Tube type: Oral Tube size: 7.0 mm Number of attempts: 1 Airway Equipment and Method: Stylet Placement Confirmation: ETT inserted through vocal cords under direct vision, positive ETCO2 and breath sounds checked- equal and bilateral Secured at: 21 cm Tube secured with: Tape Dental Injury: Teeth and Oropharynx as per pre-operative assessment

## 2020-12-22 NOTE — Anesthesia Preprocedure Evaluation (Addendum)
Anesthesia Evaluation   Patient awake    Reviewed: Allergy & Precautions, NPO status , Patient's Chart, lab work & pertinent test results  History of Anesthesia Complications Negative for: history of anesthetic complications  Airway Mallampati: I  TM Distance: >3 FB Neck ROM: Full    Dental no notable dental hx. (+) Teeth Intact   Pulmonary asthma (Mild, intermittent) ,    Pulmonary exam normal breath sounds clear to auscultation       Cardiovascular Exercise Tolerance: Good METS: 3 - Mets negative cardio ROS Normal cardiovascular exam Rhythm:Regular Rate:Normal     Neuro/Psych negative neurological ROS  negative psych ROS   GI/Hepatic Neg liver ROS, hiatal hernia, PUD, neg GERD  ,Acute Appendicitis    Endo/Other  negative endocrine ROS  Renal/GU negative Renal ROS  negative genitourinary   Musculoskeletal negative musculoskeletal ROS (+)   Abdominal   Peds  Hematology negative hematology ROS (+)   Anesthesia Other Findings   Reproductive/Obstetrics (+) Pregnancy Pt is EGA [redacted] weeks;  No issues with pregnancy                            Anesthesia Physical Anesthesia Plan  ASA: 2 and emergent  Anesthesia Plan: General   Post-op Pain Management:    Induction: Intravenous  PONV Risk Score and Plan: Ondansetron  Airway Management Planned: Oral ETT  Additional Equipment:   Intra-op Plan:   Post-operative Plan: Extubation in OR  Informed Consent: I have reviewed the patients History and Physical, chart, labs and discussed the procedure including the risks, benefits and alternatives for the proposed anesthesia with the patient or authorized representative who has indicated his/her understanding and acceptance.     Dental advisory given  Plan Discussed with: CRNA  Anesthesia Plan Comments: (Plan- GETA with RSI;  Avoid benzodiazepines, sugammadex. ;  Maintain MAPs, avoid  hypercarbia;  Benefits and risk discussed with patient to include fetal loss, death, MI and CVA.  Pt wishes to proceed.)       Anesthesia Quick Evaluation

## 2020-12-22 NOTE — Progress Notes (Signed)
Patient taken to OR via transport. Report given to OR RN. Consent signed.

## 2020-12-22 NOTE — Progress Notes (Signed)
Patient transported back to room from PACU.  VSS, patient complaining of severe pain on left side of abdomen. Dr. Maia Plan and Dalbert Garnet notified. Morphine and Roxi ordered PRN for pain control. Heating pad given to patient.

## 2020-12-22 NOTE — Op Note (Signed)
Pre-op Diagnosis: Right lower quadrant pain, rule out appendicitis   Post op Diagnosis: Right lower quadrant pain  Procedure: Robotic assisted diagnostic laparoscopy               Robotic assisted laparoscopic appendectomy  Anesthesia: GETA  Surgeon: Carolan Shiver, MD, FACS  Wound Classification: clean contaminated  Specimen: Appendix  Complications: None  Estimated Blood Loss: 3 mL   Indications: Patient is a 25 y.o. female  presented with right lower quadrant pain and 20,000 WBC count.  Despite improvement of white blood cell count, there was still significant tenderness to palpation in the right upper quadrant.  MRI nondiagnostic.  Benefit of diagnosis laparoscopy was started and risk of missing acute appendicitis so diagnostic laparoscopy with appendectomy was recommended.       FIndings: 1.  Normal appendix 2. No peri-appendiceal inflammatory tissue 3.  Enlarged right ovary compared to left ovary 4.  No other obvious pathology identified.  Orders of picture: Normal appendix Right ovary Left ovary Uterus and right ovary Uterus Right ovary Left ovary                 Description of procedure: The patient was placed on the operating table in the supine position. General anesthesia was induced. A time-out was completed verifying correct patient, procedure, site, positioning, and implant(s) and/or special equipment prior to beginning this procedure. The abdomen was prepped and draped in the usual sterile fashion.   Palmer's point located and cutdown was done and abdominal cavity entered. Trocar introduced, gas insufflation was initiated until the abdominal pressure was measured at 12 mmHg.  Afterwards.  After local was infused, 3 additional incision on the left abdominal wall were made 5 cm apart.  An 12 mm port and two other 8 mm ports were placed under direct visualization.  No injuries from trocar placements were noted.  The table was placed in the  Trendelenburg position with the right side elevated.  With the use of Tip up grasper, Fenestrated Bipolar and Scissors, a normal appendix was identified and elevated.  Window created at base of appendix in the mesentery.    The mesentery was ligated fenestrated bipolar and divided with scissors. The base of the appendix was ligated with 3-0 V-Loc.  The appendix was divided with scissors.  This time of the appendix was covered with a pursestring with a 3 oh V-Loc.  The appendiceal stump was examined and hemostasis noted.  Ovaries and tubes were identified.  No purulence or any other secretions surrounded the structures.  Gynecology service not available for further diagnostic laparoscopy.  The 12 mm trocar removed and port site closed with PMI using 0 vicryl under direct vision. Remaining trocars were removed under direct vision. No bleeding was noted.The abdomen was allowed to collapse.  All skin incisions then closed with subcuticular sutures Monocryl 4-0.  Wounds then dressed with dermabond.  The patient tolerated the procedure well, awakened from anesthesia and was taken to the postanesthesia care unit in satisfactory condition.  Sponge count and instrument count correct at the end of the procedure.

## 2020-12-22 NOTE — Transfer of Care (Signed)
Immediate Anesthesia Transfer of Care Note  Patient: Chelsea Underwood  Procedure(s) Performed: XI ROBOTIC LAPAROSCOPIC ASSISTED APPENDECTOMY  Patient Location: PACU  Anesthesia Type:General  Level of Consciousness: drowsy and patient cooperative  Airway & Oxygen Therapy: Patient Spontanous Breathing  Post-op Assessment: Report given to RN and Post -op Vital signs reviewed and stable  Post vital signs: Reviewed and stable  Last Vitals:  Vitals Value Taken Time  BP 92/76 12/22/20 1036  Temp    Pulse 77 12/22/20 1037  Resp 16 12/22/20 1037  SpO2 97 % 12/22/20 1037  Vitals shown include unvalidated device data.  Last Pain:  Vitals:   12/22/20 0800  TempSrc:   PainSc: 5          Complications: No notable events documented.

## 2020-12-22 NOTE — Progress Notes (Signed)
Patient ID: Chelsea Underwood, female   DOB: 04-29-1995, 25 y.o.   MRN: [id number]     SURGICAL PROGRESS NOTE   Hospital Day(s): 1.   Interval History: Patient had diagnostic laparoscopy done today with appendectomy.  No appendicitis.  Vital signs in last 24 hours: [min-max] current  Temp:  [97.3 F (36.3 C)-98.4 F (36.9 C)] 97.7 F (36.5 C) (10/30 1140) Pulse Rate:  [65-98] 83 (10/30 1140) Resp:  [12-25] 18 (10/30 1140) BP: (89-129)/(58-82) 108/74 (10/30 1140) SpO2:  [97 %-100 %] 100 % (10/30 1140)     Height: 5\' 3"  (160 cm) Weight: 90.7 kg BMI (Calculated): 35.44   Imaging:                 Assessment/Plan:  25 y.o. female with right lower current pain Day of Surgery s/p robotic assisted diagnostic laparoscopy with appendectomy.  Intraoperative finding shows no signs of appendicitis.  Appendix removed to avoid any confusion if patient has recurrent right upper quadrant pain.  From a standpoint patient can be discharged home and no need of antibiotics from the appendicitis standpoint.  I deferred OB/Gyn any further treatment of right lower current pain.  Not sure if the cause of the pain could be ovarian.  Patient available in this note and media tab for evaluation by obstetrician.  I will follow-up patient in my office in 2 weeks for wound follow-up.  25, MD

## 2020-12-22 NOTE — Progress Notes (Signed)
Patient ID: Chelsea Underwood, female   DOB: 1995/06/29, 25 y.o.   MRN: [id number]     [facility] Hospital Day(s): 1.   Interval History: Patient seen and examined, no acute events or new complaints overnight. Patient reports she continued having abdominal pain.  Pain localized to the right lower quadrant.  Patient just took Tylenol last night but the pain kept coming back.  Currently with severe pain in the right lower quadrant.  No pain radiation.  There has been no alleviating or aggravating factors.  Patient endorses having some nausea.  Vital signs in last 24 hours: [min-max] current  Temp:  [98.2 F (36.8 C)-98.4 F (36.9 C)] 98.3 F (36.8 C) (10/30 0759) Pulse Rate:  [76-98] 78 (10/30 0759) Resp:  [14-20] 18 (10/30 0759) BP: (98-129)/(59-82) 109/69 (10/30 0759) SpO2:  [99 %-100 %] 99 % (10/30 0759)     Height: 5\' 3"  (160 cm) Weight: 90.7 kg BMI (Calculated): 35.44   Physical Exam:  Constitutional: alert, cooperative and no distress  Respiratory: breathing non-labored at rest  Cardiovascular: regular rate and sinus rhythm  Gastrointestinal: soft, tender in the right lower quadrant, and non-distended  Labs:  CBC Latest Ref Rng & Units 12/22/2020 12/21/2020 12/21/2020  WBC 4.0 - 10.5 K/uL 12.2(H) 16.2(H) 20.5(H)  Hemoglobin 12.0 - 15.0 g/dL 11.9(L) 14.2 13.1  Hematocrit 36.0 - 46.0 % 35.9(L) 42.3 38.0  Platelets 150 - 400 K/uL 257 226 267   CMP Latest Ref Rng & Units 12/21/2020 06/30/2020  Glucose 70 - 99 mg/dL 67(L) 93  BUN 6 - 20 mg/dL 7 10  Creatinine 0.44 - 1.00 mg/dL 0.52 0.69  Sodium 135 - 145 mmol/L 135 138  Potassium 3.5 - 5.1 mmol/L 3.4(L) 3.2(L)  Chloride 98 - 111 mmol/L 105 106  CO2 22 - 32 mmol/L 27 23  Calcium 8.9 - 10.3 mg/dL 9.0 9.1  Total Protein 6.5 - 8.1 g/dL 6.7 -  Total Bilirubin 0.3 - 1.2 mg/dL 0.6 -  Alkaline Phos 38 - 126 U/L 44 -  AST 15 - 41 U/L 23 -  ALT 0 - 44 U/L 30 -    Imaging studies: No new pertinent imaging  studies   Assessment/Plan:  25 y.o. female 16-week pregnancy with right lower quadrant pain.  Even though the white blood cell count has significantly decreased patient continued having persistent localized pain in the right lower quadrant.  Very tender to palpation in the right lower quadrant.  I had a long discussion with the patient and obstetrician that with an MRI not showing the appendix, appendicitis cannot be ruled out.  I gave the option to the patient to continue antibiotic therapy versus diagnostic laparoscopy.  Both options are risky.  If appendicitis is missed patient is at risk of premature contractions and delivery.  Also if patient decides to proceed with surgical management she is at risk of anesthesia as well.  He has been described to be safe to have surgery during the second trimester.  From the 2 options I think that is less risky to do a diagnostic laparoscopy with appendectomy even if the appendix is negative.  At the same time other pelvic surgery can be visualized.  This will be the only way that if patient discharge we can be sure that she would not deteriorate with progressive appendicitis.  The patient reports she understood the benefit of the risk and she agreed to proceed with diagnostic laparoscopy with appendectomy.  Dr. Leafy Ro was present during the  evaluation and will be available if there is no appendicitis identified on diagnostic laparoscopy.  We will schedule patient to the OR today.  Gae Gallop, MD

## 2020-12-22 NOTE — Anesthesia Postprocedure Evaluation (Signed)
Anesthesia Post Note  Patient: Chelsea Underwood  Procedure(s) Performed: XI ROBOTIC LAPAROSCOPIC ASSISTED APPENDECTOMY  Patient location during evaluation: PACU Anesthesia Type: General Level of consciousness: awake and alert Pain management: pain level controlled Vital Signs Assessment: post-procedure vital signs reviewed and stable Respiratory status: spontaneous breathing, nonlabored ventilation, respiratory function stable and patient connected to nasal cannula oxygen Cardiovascular status: blood pressure returned to baseline and stable Postop Assessment: no apparent nausea or vomiting Anesthetic complications: no   No notable events documented.   Last Vitals:  Vitals:   12/22/20 1511 12/22/20 2028  BP: 120/61 114/60  Pulse: 86 79  Resp: 18 20  Temp: 37.1 C 36.9 C  SpO2: 98% 99%    Last Pain:  Vitals:   12/22/20 2028  TempSrc: Oral  PainSc:                  Felicita Gage

## 2020-12-23 ENCOUNTER — Encounter: Payer: Self-pay | Admitting: General Surgery

## 2020-12-23 MED ORDER — OXYCODONE HCL 5 MG PO TABS: 5.0000 mg | ORAL_TABLET | ORAL | 0 refills | Status: DC | PRN

## 2020-12-23 MED ORDER — ONDANSETRON 4 MG PO TBDP: 4.0000 mg | ORAL_TABLET | Freq: Four times a day (QID) | ORAL | 0 refills | Status: DC | PRN

## 2020-12-23 MED ORDER — DOCUSATE SODIUM 100 MG PO CAPS: 100.0000 mg | ORAL_CAPSULE | Freq: Two times a day (BID) | ORAL | 0 refills | Status: DC

## 2020-12-23 MED ORDER — ACETAMINOPHEN 500 MG PO TABS: 1000.0000 mg | ORAL_TABLET | Freq: Four times a day (QID) | ORAL | 0 refills | Status: AC

## 2020-12-23 NOTE — Discharge Summary (Signed)
GynecologicalDischarge Summary  Patient Name: Chelsea Underwood DOB: 1995-07-19 MRN: [id number]  Date of Admission: 12/21/2020 Date of Discharge: 12/23/2020  Hospital course:   Pt admitted with RLQ pain at 16wks without radiographic findings of appendicitis, but no other explanation for her pain. WBC elevated to 20, normal pregnancy. Broad-spectrum abx initiated and 24hr obs, without resolution of her pain. General surgery took her to the OR for robotically assisted dx lap and appendectomy. No findings of appendicitis on laparoscopy, but her pain did resolve postop.   She was discharged home on POD#1 when she was tolerating a regular diet, pain was controlled with po pain medications, and she was ambulating and voiding without difficulty. Vital signs were stable and physical exam remained benign throughout her hospital stay. Incisions were clean, dry, and intact with dermabond.  She will follow up per below for post-op check.  Rx given for tylenol and oxy, Zofran, and Colace.    She was given specific instructions and numbers to call in written and verbal format. She verbalized understanding, agrees with the plan of care, and all questions answered to her satisfaction.  She was not discharged home on abx.  Discharge Physical Exam:  BP (!) 104/57 (BP Location: Left Arm)   Pulse 66   Temp 98.7 F (37.1 C) (Oral)   Resp 20   Ht 5\' 3"  (1.6 m)   Wt 90.7 kg   LMP 06/12/2020   SpO2 96%   BMI 35.43 kg/m   General: NAD CV: RRR Pulm: CTABL, nl effort ABD: s/nd/nt, fundus non tender Incision: c/d/i  DVT Evaluation: LE non-ttp, no evidence of DVT on exam.  Hemoglobin  Date Value Ref Range Status  12/22/2020 11.9 (L) 12.0 - 15.0 g/dL Final   HCT  Date Value Ref Range Status  12/22/2020 35.9 (L) 36.0 - 46.0 % Final      Plan:  Chelsea Underwood was discharged to home in good condition. Follow-up appointment at [facility] in 2 weeks   Discharge  Medications: Allergies as of 12/23/2020       Reactions   Amoxicillin Anaphylaxis, Itching   Cefdinir Anaphylaxis, Shortness Of Breath, Swelling   Other reaction(s): Facial Swelling, Wheezing   Other Anaphylaxis   Tree Nuts   Peanut Butter Flavor Anaphylaxis   Peanut Oil Anaphylaxis, Shortness Of Breath   Penicillin V Anaphylaxis   Apple Itching   Peanut-containing Drug Products    Penicillins         Medication List     TAKE these medications    acetaminophen 500 MG tablet Commonly known as: Acetaminophen Extra Strength Take 2 tablets (1,000 mg total) by mouth every 6 (six) hours for 3 days.   albuterol 108 (90 Base) MCG/ACT inhaler Commonly known as: VENTOLIN HFA Inhale 2 puffs into the lungs every 6 (six) hours as needed.   aspirin EC 81 MG tablet Take 81 mg by mouth daily. Swallow whole.   docusate sodium 100 MG capsule Commonly known as: COLACE Take 1 capsule (100 mg total) by mouth 2 (two) times daily. To keep stools soft   EPINEPHrine 0.3 mg/0.3 mL Soaj injection Commonly known as: EPI-PEN epinephrine 0.3 mg/0.3 mL injection, auto-injector  INJECT 0.3 ML (0.3 MG TOTAL) UNDER THE SKIN ONCE FOR 1 DOSE.   ondansetron 4 MG disintegrating tablet Commonly known as: Zofran ODT Take 1 tablet (4 mg total) by mouth every 6 (six) hours as needed for nausea.   oxyCODONE 5 MG immediate release tablet Commonly known as: Oxy  IR/ROXICODONE Take 1 tablet (5 mg total) by mouth every 4 (four) hours as needed for severe pain.   Prenatal 28-0.8 MG Tabs Take 1 tablet by mouth daily.        Signed: Christeen Douglas, MD 7:32 AM

## 2020-12-23 NOTE — Progress Notes (Signed)
Patient ID: Chelsea Underwood, female   DOB: 09/26/1995, 25 y.o.   MRN: [id number]     SURGICAL PROGRESS NOTE   Hospital Day(s): 2.   Interval History: Patient seen and examined, no acute events or new complaints overnight. Patient reports feeling better.  He endorses that the pain today is on the left side.  He endorses that the pain on the right side has resolved.  Denies any nausea or vomiting.  Patient is able to ambulate.  Vital signs in last 24 hours: [min-max] current  Temp:  [97.3 F (36.3 C)-98.8 F (37.1 C)] 98.7 F (37.1 C) (10/31 0243) Pulse Rate:  [65-86] 66 (10/31 0243) Resp:  [12-25] 20 (10/31 0243) BP: (89-120)/(57-76) 104/57 (10/31 0243) SpO2:  [96 %-100 %] 96 % (10/31 0243)     Height: 5\' 3"  (160 cm) Weight: 90.7 kg BMI (Calculated): 35.44   Physical Exam:  Constitutional: alert, cooperative and no distress  Respiratory: breathing non-labored at rest  Cardiovascular: regular rate and sinus rhythm  Gastrointestinal: soft, non-tender, and non-distended.  Wounds are dry and clean  Labs:  CBC Latest Ref Rng & Units 12/22/2020 12/21/2020 12/21/2020  WBC 4.0 - 10.5 K/uL 12.2(H) 16.2(H) 20.5(H)  Hemoglobin 12.0 - 15.0 g/dL 11.9(L) 14.2 13.1  Hematocrit 36.0 - 46.0 % 35.9(L) 42.3 38.0  Platelets 150 - 400 K/uL 257 226 267   CMP Latest Ref Rng & Units 12/21/2020 06/30/2020  Glucose 70 - 99 mg/dL 08/30/2020) 93  BUN 6 - 20 mg/dL 7 10  Creatinine 75(I - 1.00 mg/dL 4.33 2.95  Sodium 1.88 - 145 mmol/L 135 138  Potassium 3.5 - 5.1 mmol/L 3.4(L) 3.2(L)  Chloride 98 - 111 mmol/L 105 106  CO2 22 - 32 mmol/L 27 23  Calcium 8.9 - 10.3 mg/dL 9.0 9.1  Total Protein 6.5 - 8.1 g/dL 6.7 -  Total Bilirubin 0.3 - 1.2 mg/dL 0.6 -  Alkaline Phos 38 - 126 U/L 44 -  AST 15 - 41 U/L 23 -  ALT 0 - 44 U/L 30 -    Imaging studies: No new pertinent imaging studies   Assessment/Plan:  25 y.o. female with right lower quadrant pain 1 Day Post-Op s/p robotic assisted laparoscopic  appendectomy.  Patient evaluated today.  Pain under control with current medications.  Patient ambulating.  Patient tolerating diet.  She denies any right lower quadrant pain.  Pain is basically on the left upper incision.  No contraindication for discharge.  I will follow her in 2 weeks.  25, MD

## 2020-12-23 NOTE — Discharge Instructions (Signed)
Laparoscopic Discharge Instructions  Postop constipation is a major cause of pain. Stay well hydrated, walk as you tolerate, and take over the counter senna as well as stool softeners if you need them.   RISKS AND COMPLICATIONS  Infection. Bleeding. Injury to surrounding organs. Anesthetic side effects.   PROCEDURE  You may be given a medicine to help you relax (sedative) before the procedure. You will be given a medicine to make you sleep (general anesthetic) during the procedure. A tube will be put down your throat to help your breath while under general anesthesia. Several small cuts (incisions) are made in the lower abdominal area and one incision is made near the belly button. Your abdominal area will be inflated with a safe gas (carbon dioxide). This helps give the surgeon room to operate, visualize, and helps the surgeon avoid other organs. A thin, lighted tube (laparoscope) with a camera attached is inserted into your abdomen through the incision near the belly button. Other small instruments may also be inserted through other abdominal incisions. The ovary is located and are removed. After the ovary is removed, the gas is released from the abdomen. The incisions will be closed with stitches (sutures), and Dermabond. A bandage may be placed over the incisions.  AFTER THE PROCEDURE  You will also have some mild abdominal discomfort for 3-7 days. You will be given pain medicine to ease any discomfort. As long as there are no problems, you may be allowed to go home. Someone will need to drive you home and be with you for at least 24 hours once home. You may have some mild discomfort in the throat. This is from the tube placed in your throat while you were sleeping. You may experience discomfort in the shoulder area from some trapped air between the liver and diaphragm. This sensation is normal and will slowly go away on its own.  HOME CARE INSTRUCTIONS  Take all medicines as  directed. Only take over-the-counter or prescription medicines for pain, discomfort, or fever as directed by your caregiver. Resume daily activities as directed. Showers are preferred over baths for 2 weeks. You may resume sexual activities in 1 week or as you feel you would like to. Do not drive while taking narcotics.  SEEK MEDICAL CARE IF: . There is increasing abdominal pain. You feel lightheaded or faint. You have the chills. You have an oral temperature above 102 F (38.9 C). There is pus-like (purulent) drainage from any of the wounds. You are unable to pass gas or have a bowel movement. You feel sick to your stomach (nauseous) or throw up (vomit) and can't control it with your medicines.  MAKE SURE YOU:  Understand these instructions. Will watch your condition. Will get help right away if you are not doing well or get worse.  ExitCare Patient Information 2013 [place], [place].

## 2020-12-23 NOTE — Progress Notes (Signed)
Patient discharged home with partner.  Discharge instructions, when to follow up, and prescriptions reviewed with patient.  Patient verbalized understanding. Patient escorted out by auxiliary.

## 2020-12-25 LAB — SURGICAL PATHOLOGY

## 2020-12-29 DIAGNOSIS — R42 Dizziness and giddiness: Secondary | ICD-10-CM | POA: Insufficient documentation

## 2020-12-29 DIAGNOSIS — Z5321 Procedure and treatment not carried out due to patient leaving prior to being seen by health care provider: Secondary | ICD-10-CM | POA: Insufficient documentation

## 2020-12-29 DIAGNOSIS — R112 Nausea with vomiting, unspecified: Secondary | ICD-10-CM | POA: Insufficient documentation

## 2020-12-29 LAB — URINALYSIS, ROUTINE W REFLEX MICROSCOPIC
Bilirubin Urine: NEGATIVE
Glucose, UA: NEGATIVE mg/dL
Hgb urine dipstick: NEGATIVE
Ketones, ur: NEGATIVE mg/dL
Leukocytes,Ua: NEGATIVE
Nitrite: NEGATIVE
Protein, ur: NEGATIVE mg/dL
Specific Gravity, Urine: 1.014 (ref 1.005–1.030)
pH: 5 (ref 5.0–8.0)

## 2020-12-29 LAB — COMPREHENSIVE METABOLIC PANEL
ALT: 22 U/L (ref 0–44)
AST: 19 U/L (ref 15–41)
Albumin: 3.3 g/dL — ABNORMAL LOW (ref 3.5–5.0)
Alkaline Phosphatase: 47 U/L (ref 38–126)
Anion gap: 8 (ref 5–15)
BUN: 10 mg/dL (ref 6–20)
CO2: 21 mmol/L — ABNORMAL LOW (ref 22–32)
Calcium: 8.8 mg/dL — ABNORMAL LOW (ref 8.9–10.3)
Chloride: 107 mmol/L (ref 98–111)
Creatinine, Ser: 0.47 mg/dL (ref 0.44–1.00)
GFR, Estimated: >60 mL/min (ref >60)
Glucose, Bld: 88 mg/dL (ref 70–99)
Potassium: 3.7 mmol/L (ref 3.5–5.1)
Sodium: 136 mmol/L (ref 135–145)
Total Bilirubin: 0.4 mg/dL (ref 0.3–1.2)
Total Protein: 6.6 g/dL (ref 6.5–8.1)

## 2020-12-29 LAB — CBC WITH DIFFERENTIAL/PLATELET
Abs Immature Granulocytes: 0.14 10*3/uL — ABNORMAL HIGH (ref 0.00–0.07)
Basophils Absolute: 0.1 10*3/uL (ref 0.0–0.1)
Basophils Relative: 0 %
Eosinophils Absolute: 0.1 10*3/uL (ref 0.0–0.5)
Eosinophils Relative: 1 %
HCT: 38.8 % (ref 36.0–46.0)
Hemoglobin: 12.8 g/dL (ref 12.0–15.0)
Immature Granulocytes: 1 %
Lymphocytes Relative: 18 %
Lymphs Abs: 3.7 10*3/uL (ref 0.7–4.0)
MCH: 31.2 pg (ref 26.0–34.0)
MCHC: 33 g/dL (ref 30.0–36.0)
MCV: 94.6 fL (ref 80.0–100.0)
Monocytes Absolute: 1.4 10*3/uL — ABNORMAL HIGH (ref 0.1–1.0)
Monocytes Relative: 7 %
Neutro Abs: 14.7 10*3/uL — ABNORMAL HIGH (ref 1.7–7.7)
Neutrophils Relative %: 73 %
Platelets: 265 10*3/uL (ref 150–400)
RBC: 4.1 MIL/uL (ref 3.87–5.11)
RDW: 12.9 % (ref 11.5–15.5)
WBC: 20.1 10*3/uL — ABNORMAL HIGH (ref 4.0–10.5)
nRBC: 0 % (ref 0.0–0.2)

## 2020-12-29 LAB — CBG MONITORING, ED: Glucose-Capillary: 89 mg/dL (ref 70–99)

## 2020-12-29 NOTE — ED Provider Notes (Addendum)
Emergency Medicine Provider Triage Evaluation Note  Chelsea Underwood , a 25 y.o. female  was evaluated in triage.  Pt complains of sweaty, shaking, dizzy, felt like she got overheated. [redacted] weeks pregnant. When she stands, she feels dizzy and vomits. 3 episodes today.  Review of Systems  Positive: Dizziness, nausea, vomiting Negative: Fever  Physical Exam  BP (!) 103/59 (BP Location: Right Arm)   Pulse 83   Temp 99 F (37.2 C) (Oral)   Resp 18   Ht 5\' 3"  (1.6 m)   Wt 94.3 kg   LMP 08/31/2020   SpO2 99%   BMI 36.85 kg/m  Gen:   Awake, no distress   Resp:  Normal effort  MSK:   Moves extremities without difficulty  Other:   Medical Decision Making  Medically screening exam initiated at 10:56 PM.  Appropriate orders placed.  Kingston Reese-Davies was informed that the remainder of the evaluation will be completed by another provider, this initial triage assessment does not replace that evaluation, and the importance of remaining in the ED until their evaluation is complete.  Has had prenatal care. No history of gestational diabetes. POCUS FHR 152.   Ferdie Ping, FNP 12/29/20 2259    13/06/22, FNP 12/29/20 2315    13/06/22, MD 12/29/20 [phone]

## 2020-12-29 NOTE — ED Triage Notes (Signed)
Pt endorsing 'overheated' feeling with nausea and vomitting. Pt endorsing feeling dizzy at times with standing.

## 2020-12-30 ENCOUNTER — Emergency Department
Admission: EM | Admit: 2020-12-30 | Discharge: 2020-12-30 | Disposition: A | Discharge status: Home / Self Care | Payer: BC Managed Care – PPO | Attending: Student in an Organized Health Care Education/Training Program | Admitting: Student in an Organized Health Care Education/Training Program

## 2020-12-30 LAB — HCG, QUANTITATIVE, PREGNANCY: hCG, Beta Chain, Quant, S: 15250 m[IU]/mL — ABNORMAL HIGH (ref <5)

## 2020-12-30 NOTE — ED Notes (Signed)
Registration at front desk informed writer pt left WR after informing of so.

## 2021-02-23 NOTE — L&D Delivery Note (Signed)
Delivery Note ? ?Chelsea Underwood is a G2P0010 at [redacted]w[redacted]d with an LMP of 09/01/2021, consistent with [place] at [redacted]w[redacted]d.  ? ?First Stage: ?Labor onset: 0830 ?Augmentation: oxytocin ?Analgesia /Anesthesia intrapartum: IVPM, Epidural ?PROM at 0100 ?GBS: negative ? ?Second Stage: ?Complete dilation at 1320 ?Onset of pushing at 1320 ?FHR second stage 135bpm with moderate variability, intermittent variables and lates with pushing  ? ?Darlisha presented to L&D with PROM.  She was augmented with oxytocin and received IVPM in early labor.  She progressed rapidly from 3cm tp C/C/+2 with an urge to push after receiving an epidural.  She pushed effectively over approximately 50 minutes for a spontaneous vaginal birth ?Delivery of a viable baby boy on 06/07/2021 at 1408 by CNM ?Delivery of fetal head in OA position with restitution to LOT. ?Loose nuchal cord x 1, easily reduced;  Anterior then posterior shoulders delivered easily with gentle downward traction. Baby placed on mom's chest, and attended to by baby RN ?Cord double clamped after cessation of pulsation, cut by father of baby. ? ?Cord blood sample collection: Yes O POS ? ?Third Stage: ?Oxytocin bolus started after delivery of infant for hemorrhage prophylaxis  ?Placenta delivered  intact with 3 VC @ 1415 ?Placenta disposition: discarded  ?Uterine tone firm / bleeding moderate ? ?1st vaginal laceration identified  ?Anesthesia for repair: Epidural ?Repair with 2-0 vicryl CT in standard fashion ?Est. Blood Loss (mL): ? ?Complications: None ? ?Mom to postpartum.  Baby to Couplet care / Skin to Skin. ? ?Newborn: ?Information for the patient's newborn:  Adra, Shepler [[id number]]  ?Live born female "Dakari" ?Birth Weight:  Pending  ?APGAR: 9, 9 ? ?Newborn Delivery   ?Birth date/time: 06[birth year]21 14:08:00 ?Delivery type: Vaginal, Spontaneous ?  ?  ?Feeding planned: breast ? ?---------- ?Margaretmary Eddy, CNM ?Certified Nurse Midwife ?[facility] ?[facility]  ? ?

## 2021-03-24 ENCOUNTER — Other Ambulatory Visit: Payer: Self-pay

## 2021-03-24 ENCOUNTER — Ambulatory Visit (LOCAL_COMMUNITY_HEALTH_CENTER): Payer: Medicaid Other

## 2021-03-24 DIAGNOSIS — Z23 Encounter for immunization: Secondary | ICD-10-CM

## 2021-03-24 NOTE — Progress Notes (Signed)
In Nurse Clinic for Tdap. Pt is pregnant (29 weeks 2 days)  and receiving prenatal care at [facility]. Tdap given and tolerated well. Updated [facility] copy given. Jerel Shepherd, RN

## 2021-04-15 ENCOUNTER — Other Ambulatory Visit: Payer: Self-pay

## 2021-04-15 ENCOUNTER — Observation Stay: Payer: Medicaid Other

## 2021-04-15 ENCOUNTER — Observation Stay
Admission: EM | Admit: 2021-04-15 | Discharge: 2021-04-15 | Disposition: A | Discharge status: Home / Self Care | Payer: Medicaid Other | Attending: Obstetrics and Gynecology | Admitting: Obstetrics and Gynecology

## 2021-04-15 ENCOUNTER — Encounter: Payer: Self-pay | Admitting: Obstetrics and Gynecology

## 2021-04-15 DIAGNOSIS — O4703 False labor before 37 completed weeks of gestation, third trimester: Principal | ICD-10-CM | POA: Insufficient documentation

## 2021-04-15 DIAGNOSIS — Z3A32 32 weeks gestation of pregnancy: Secondary | ICD-10-CM | POA: Insufficient documentation

## 2021-04-15 DIAGNOSIS — O99513 Diseases of the respiratory system complicating pregnancy, third trimester: Secondary | ICD-10-CM | POA: Insufficient documentation

## 2021-04-15 DIAGNOSIS — J45909 Unspecified asthma, uncomplicated: Secondary | ICD-10-CM | POA: Diagnosis not present

## 2021-04-15 DIAGNOSIS — O479 False labor, unspecified: Secondary | ICD-10-CM | POA: Diagnosis present

## 2021-04-15 LAB — WET PREP, GENITAL
Clue Cells Wet Prep HPF POC: NONE SEEN
Sperm: NONE SEEN
Trich, Wet Prep: NONE SEEN
WBC, Wet Prep HPF POC: <10 (ref <10)
Yeast Wet Prep HPF POC: NONE SEEN

## 2021-04-15 LAB — URINALYSIS, COMPLETE (UACMP) WITH MICROSCOPIC
Bilirubin Urine: NEGATIVE
Glucose, UA: >=500 mg/dL — AB
Hgb urine dipstick: NEGATIVE
Ketones, ur: NEGATIVE mg/dL
Leukocytes,Ua: NEGATIVE
Nitrite: NEGATIVE
Protein, ur: NEGATIVE mg/dL
Specific Gravity, Urine: 1.006 (ref 1.005–1.030)
pH: 6 (ref 5.0–8.0)

## 2021-04-15 LAB — RUPTURE OF MEMBRANE (ROM)PLUS: Rom Plus: NEGATIVE

## 2021-04-15 LAB — GLUCOSE, CAPILLARY: Glucose-Capillary: 70 mg/dL (ref 70–99)

## 2021-04-15 MED ORDER — ACETAMINOPHEN 325 MG PO TABS
ORAL_TABLET | ORAL | Status: AC
  Filled 2021-04-15: qty 2

## 2021-04-15 MED ORDER — ACETAMINOPHEN 325 MG PO TABS
650.0000 mg | ORAL_TABLET | ORAL | Status: DC | PRN
  Administered 2021-04-15: 650 mg via ORAL

## 2021-04-15 NOTE — Discharge Summary (Signed)
Patient ID: Chelsea Underwood MRN: [id number] DOB/AGE: 17-Jun-1995 26 y.o.  Admit date: 04/15/2021 Discharge date: 04/15/2021  Admission Diagnoses: 26yo G2P0 at [redacted]w[redacted]d presents with UCs, VB, and LOF. Reports + FM.   Discharge Diagnoses: No VB or LOF, No cervical dilation or shortening  Factors complicating pregnancy: 1. Prefers blood products from family members 2. Elevated 1hr GTT 3. Obesity in pregnancy - BMI 36  4. Uterine S>D 5. H/o mental health diagnoses 6. Asthma 7. Eye condition, swelling of the retina. Cleared by eye doctor at beginning of this year  Prenatal Procedures: NST and ultrasound  Consults: None  Significant Diagnostic Studies:  Results for orders placed or performed during the hospital encounter of 04/15/21 (from the past 168 hour(s))  Wet prep, genital   Collection Time: 04/15/21  9:02 PM  Result Value Ref Range   Yeast Wet Prep HPF POC NONE SEEN NONE SEEN   Trich, Wet Prep NONE SEEN NONE SEEN   Clue Cells Wet Prep HPF POC NONE SEEN NONE SEEN   WBC, Wet Prep HPF POC <10 <10   Sperm NONE SEEN   Urinalysis, Complete w Microscopic   Collection Time: 04/15/21  9:02 PM  Result Value Ref Range   Color, Urine YELLOW (A) YELLOW   APPearance CLEAR (A) CLEAR   Specific Gravity, Urine 1.006 1.005 - 1.030   pH 6.0 5.0 - 8.0   Glucose, UA >=500 (A) NEGATIVE mg/dL   Hgb urine dipstick NEGATIVE NEGATIVE   Bilirubin Urine NEGATIVE NEGATIVE   Ketones, ur NEGATIVE NEGATIVE mg/dL   Protein, ur NEGATIVE NEGATIVE mg/dL   Nitrite NEGATIVE NEGATIVE   Leukocytes,Ua NEGATIVE NEGATIVE   RBC / HPF 0-5 0 - 5 RBC/hpf   WBC, UA 0-5 0 - 5 WBC/hpf   Bacteria, UA RARE (A) NONE SEEN   Squamous Epithelial / LPF 0-5 0 - 5  ROM Plus ([facility] only)   Collection Time: 04/15/21  9:02 PM  Result Value Ref Range   Rom Plus NEGATIVE   Glucose, capillary   Collection Time: 04/15/21  9:58 PM  Result Value Ref Range   Glucose-Capillary 70 70 - 99 mg/dL   US OB Limited  Result Date:  04/15/2021 CLINICAL DATA:  Preterm contractions, third trimester. Need cervical length. Gestational age by first trimester of 32 weeks and 2 days. Estimated due date by first trimester 06/08/2021. EXAM: LIMITED OBSTETRIC ULTRASOUND COMPARISON:  None. FINDINGS: Number of Fetuses: 1 Heart Rate:  143 bpm Movement: Yes Presentation: Cephalic Placental Location: Anterior Previa: No Amniotic Fluid (Subjective):  Within normal limits. BPD: 7.91  cm 31 w  5 d MATERNAL FINDINGS: Cervix:  Appears closed.  Measures 3.9 cm. Uterus/Adnexae: No abnormality visualized. IMPRESSION: 1. Single live intrauterine pregnancy with a gestational age of [redacted] weeks and 5 days which is concordant with gestational age by first ultrasound of 32 weeks and 2 days. 2. Closed cervix measuring 3.9 cm. This exam is performed on an emergent basis and does not comprehensively evaluate fetal size, dating, or anatomy; follow-up complete OB US should be considered if further fetal assessment is warranted. Electronically Signed   By: Tish Frederickson M.D.   On: 04/15/2021 22:14      Treatments: none  Hospital Course:  This is a 26 y.o. G2P0010 with IUP at [redacted]w[redacted]d resents with UCs, VB, and LOF.  Lab results WNL.  Ultrasound shown above, cervical length 3.9cm and closed.  She was observed, fetal heart rate monitoring remained reassuring, and she had no signs/symptoms of preterm  labor or other maternal-fetal concerns.  She was deemed stable for discharge to home with outpatient follow up.  Discharge Physical Exam:  BP 124/80    Pulse (!) 104    Temp 99.2 F (37.3 C) (Oral)    Resp 20    Ht 5\' 3"  (1.6 m)    Wt 103.4 kg    LMP 08/31/2020    BMI 40.39 kg/m   NST: FHR baseline: 150 bpm Variability: moderate Accelerations: yes Decelerations: none Category/reactivity: reactive Amniotic Fluid Index: WNL   TOCO: quiet SVE: deferred      Discharge Condition: Stable  Disposition: Discharge disposition: 01-Home or Self  Care        Allergies as of 04/15/2021       Reactions   Amoxicillin Anaphylaxis, Itching   Cefdinir Anaphylaxis, Shortness Of Breath, Swelling   Other reaction(s): Facial Swelling, Wheezing   Other Anaphylaxis   Tree Nuts   Peanut Butter Flavor Anaphylaxis   Peanut Oil Anaphylaxis, Shortness Of Breath   Penicillin V Anaphylaxis   Apple Juice Itching   Peanut-containing Drug Products    Penicillins         Medication List     STOP taking these medications    ondansetron 4 MG disintegrating tablet Commonly known as: Zofran ODT   oxyCODONE 5 MG immediate release tablet Commonly known as: Oxy IR/ROXICODONE       TAKE these medications    albuterol 108 (90 Base) MCG/ACT inhaler Commonly known as: VENTOLIN HFA Inhale 2 puffs into the lungs every 6 (six) hours as needed.   aspirin EC 81 MG tablet Take 81 mg by mouth daily. Swallow whole.   docusate sodium 100 MG capsule Commonly known as: COLACE Take 1 capsule (100 mg total) by mouth 2 (two) times daily. To keep stools soft   EPINEPHrine 0.3 mg/0.3 mL Soaj injection Commonly known as: EPI-PEN epinephrine 0.3 mg/0.3 mL injection, auto-injector  INJECT 0.3 ML (0.3 MG TOTAL) UNDER THE SKIN ONCE FOR 1 DOSE.   Prenatal 28-0.8 MG Tabs Take 1 tablet by mouth daily.         Signed2/23/2023, CNM 04/15/2021 10:33 PM

## 2021-04-15 NOTE — OB Triage Note (Signed)
Pt is G1P0 at 32.2w c/o UCs, bleeding after wiping in BR and leaking of fluid. States she contracts every night and had office visit today where was told it was normal. Pt states around 6pm increased discomfort. +FM  FHR 155

## 2021-05-18 ENCOUNTER — Observation Stay
Admission: EM | Admit: 2021-05-18 | Discharge: 2021-05-18 | Disposition: A | Discharge status: Home / Self Care | Payer: Medicaid Other | Attending: Obstetrics and Gynecology | Admitting: Obstetrics and Gynecology

## 2021-05-18 ENCOUNTER — Encounter: Payer: Self-pay | Admitting: Obstetrics and Gynecology

## 2021-05-18 ENCOUNTER — Other Ambulatory Visit: Payer: Self-pay

## 2021-05-18 DIAGNOSIS — Z91010 Allergy to peanuts: Secondary | ICD-10-CM | POA: Insufficient documentation

## 2021-05-18 DIAGNOSIS — O4193X0 Disorder of amniotic fluid and membranes, unspecified, third trimester, not applicable or unspecified: Secondary | ICD-10-CM | POA: Insufficient documentation

## 2021-05-18 DIAGNOSIS — J45909 Unspecified asthma, uncomplicated: Secondary | ICD-10-CM | POA: Insufficient documentation

## 2021-05-18 DIAGNOSIS — O26893 Other specified pregnancy related conditions, third trimester: Secondary | ICD-10-CM | POA: Diagnosis present

## 2021-05-18 DIAGNOSIS — Z7982 Long term (current) use of aspirin: Secondary | ICD-10-CM | POA: Insufficient documentation

## 2021-05-18 DIAGNOSIS — N898 Other specified noninflammatory disorders of vagina: Secondary | ICD-10-CM | POA: Diagnosis not present

## 2021-05-18 DIAGNOSIS — Z3A37 37 weeks gestation of pregnancy: Secondary | ICD-10-CM | POA: Insufficient documentation

## 2021-05-18 DIAGNOSIS — O99513 Diseases of the respiratory system complicating pregnancy, third trimester: Secondary | ICD-10-CM | POA: Insufficient documentation

## 2021-05-18 LAB — RUPTURE OF MEMBRANE (ROM)PLUS: Rom Plus: NEGATIVE

## 2021-05-18 MED ORDER — ACETAMINOPHEN 325 MG PO TABS: 650.0000 mg | ORAL_TABLET | ORAL | Status: DC | PRN

## 2021-05-18 NOTE — Discharge Summary (Signed)
Chelsea Underwood is a 26 y.o. female. She is at [redacted]w[redacted]d gestation. Patient's last menstrual period was 08/31/2020. ?Estimated Date of Delivery: 06/08/21 ? ?Prenatal care site: [facility] ? ?Current pregnancy complicated by:  ?1. Prefers blood products from family members ?2. Elevated 1hr GTT ?3. Obesity in pregnancy - BMI 36  ?4. Uterine S>D ?5. H/o mental health diagnoses ?6. Asthma ?7. Eye condition, swelling of the retina. Cleared by eye doctor at beginning of this year ? ?Chief complaint: vaginal discharge, believes she is leaking amniotic fluid. ? ?S: Resting comfortably. no CTX, no VB.no LOF,  Active fetal movement. Denies: HA, visual changes, SOB, or RUQ/epigastric pain ? ?Maternal Medical History:  ? ?Past Medical History:  ?Diagnosis Date  ? Asthma   ? ? ?Past Surgical History:  ?Procedure Laterality Date  ? XI ROBOTIC LAPAROSCOPIC ASSISTED APPENDECTOMY N/A 12/22/2020  ? Procedure: XI ROBOTIC LAPAROSCOPIC ASSISTED APPENDECTOMY;  Surgeon: Carolan Shiver, MD;  Location: [facility] ORS;  Service: General;  Laterality: N/A;  ? ? ?Allergies  ?Allergen Reactions  ? Amoxicillin Anaphylaxis and Itching  ? Cefdinir Anaphylaxis, Shortness Of Breath and Swelling  ?  Other reaction(s): Facial Swelling, Wheezing  ? Other Anaphylaxis  ?  Tree Nuts  ? Peanut Butter Flavor Anaphylaxis  ? Peanut Oil Anaphylaxis and Shortness Of Breath  ? Penicillin V Anaphylaxis  ? Apple Juice Itching  ? Peanut-Containing Drug Products   ? Penicillins   ? ? ?Prior to Admission medications   ?Medication Sig Start Date End Date Taking? Authorizing Provider  ?Prenatal 28-0.8 MG TABS Take 1 tablet by mouth daily.   Yes [provider]  ?albuterol (VENTOLIN HFA) 108 (90 Base) MCG/ACT inhaler Inhale 2 puffs into the lungs every 6 (six) hours as needed. ?Patient not taking: Reported on 05/18/2021 08/22/15 07/15/21  [provider]  ?aspirin EC 81 MG tablet Take 81 mg by mouth daily. Swallow whole. ?Patient not taking:  Reported on 03/24/2021    [provider]  ?docusate sodium (COLACE) 100 MG capsule Take 1 capsule (100 mg total) by mouth 2 (two) times daily. To keep stools soft ?Patient not taking: Reported on 05/18/2021 12/23/20   Christeen Douglas, MD  ?EPINEPHrine 0.3 mg/0.3 mL IJ SOAJ injection epinephrine 0.3 mg/0.3 mL injection, auto-injector ? INJECT 0.3 ML (0.3 MG TOTAL) UNDER THE SKIN ONCE FOR 1 DOSE. 02/05/17   [provider]  ? ? ? ? ?Social History: She  reports that she has never smoked. She has never used smokeless tobacco. She reports current alcohol use. ? ?Family History: family history is not on file.  no history of gyn cancers ? ?Review of Systems: A full review of systems was performed and negative except as noted in the HPI.   ? ? ?O: ? BP 121/71   Pulse 85   Temp (!) 97.5 ?F (36.4 ?C) (Axillary)   Resp 18   Ht 5\' 3"  (1.6 m)   Wt 105.7 kg   LMP 08/31/2020   BMI 41.27 kg/m?  ?BP recheck: 121/71 ?Results for orders placed or performed during the hospital encounter of 05/18/21 (from the past 48 hour(s))  ?ROM Plus ([facility] only)  ? Collection Time: 05/18/21 10:13 PM  ?Result Value Ref Range  ? Rom Plus NEGATIVE   ?  ? ? ?Constitutional: NAD, AAOx3  ?HE/ENT: extraocular movements grossly intact, moist mucous membranes ?CV: RRR ?PULM: nl respiratory effort, CTABL     ?Abd: gravid, non-tender, non-distended, soft      ?Ext: Non-tender, Nonedematous   ?  Psych: mood appropriate, speech normal ?Pelvic: 1/thick/ballottable ? ?Fetal  monitoring: Cat 1  ?Baseline: 140bpm ?Variability: moderate ?Accelerations: present x >2 ?Decelerations absent ?Time ? ?A/P: 26 y.o. [redacted]w[redacted]d here for antenatal surveillance for vaginal discharge ? ?Principle Diagnosis: Vaginal discharge in 3rd trimester ? ?Labor: not present.  ?ROM Plus: negative. No pooling. No vaginal discharge noted on SVE. No continued leaking fluid. Amniotic fluid is not ruptured. Educated on S/S of SROM vs normal vaginal discharge and when to  return. ?Fetal Wellbeing: Reassuring Cat 1 tracing. ?Reactive NST  ?D/c home stable, precautions reviewed, follow-up as scheduled.  ? ? ?Janyce Llanos, CNM ?05/18/2021 ?11:02 PM ? ?

## 2021-05-18 NOTE — OB Triage Note (Signed)
Patient is a G2P0 at 37 weeks 0 days presenting to Labor and Delivery for contractions and LOF since 2000. Patient denies vaginal bleeding and reports positive FM. VSS. Monitors applied and assessing. Donato Schultz CNM in department and received triage report from RN.  ?

## 2021-05-25 ENCOUNTER — Other Ambulatory Visit: Payer: Self-pay

## 2021-05-25 ENCOUNTER — Observation Stay
Admission: EM | Admit: 2021-05-25 | Discharge: 2021-05-25 | Disposition: A | Discharge status: Home / Self Care | Payer: Medicaid Other | Attending: Obstetrics | Admitting: Obstetrics

## 2021-05-25 ENCOUNTER — Encounter: Payer: Self-pay | Admitting: Obstetrics and Gynecology

## 2021-05-25 DIAGNOSIS — Z3A38 38 weeks gestation of pregnancy: Secondary | ICD-10-CM | POA: Diagnosis not present

## 2021-05-25 DIAGNOSIS — Z6836 Body mass index (BMI) 36.0-36.9, adult: Secondary | ICD-10-CM | POA: Diagnosis not present

## 2021-05-25 DIAGNOSIS — O99513 Diseases of the respiratory system complicating pregnancy, third trimester: Secondary | ICD-10-CM | POA: Insufficient documentation

## 2021-05-25 DIAGNOSIS — Z91010 Allergy to peanuts: Secondary | ICD-10-CM | POA: Diagnosis not present

## 2021-05-25 DIAGNOSIS — O99213 Obesity complicating pregnancy, third trimester: Secondary | ICD-10-CM | POA: Diagnosis not present

## 2021-05-25 DIAGNOSIS — O479 False labor, unspecified: Secondary | ICD-10-CM | POA: Diagnosis present

## 2021-05-25 DIAGNOSIS — O471 False labor at or after 37 completed weeks of gestation: Secondary | ICD-10-CM | POA: Diagnosis present

## 2021-05-25 DIAGNOSIS — Z7982 Long term (current) use of aspirin: Secondary | ICD-10-CM | POA: Insufficient documentation

## 2021-05-25 DIAGNOSIS — J45909 Unspecified asthma, uncomplicated: Secondary | ICD-10-CM | POA: Diagnosis not present

## 2021-05-25 DIAGNOSIS — E669 Obesity, unspecified: Secondary | ICD-10-CM | POA: Insufficient documentation

## 2021-05-25 LAB — WET PREP, GENITAL
Clue Cells Wet Prep HPF POC: NONE SEEN
Sperm: NONE SEEN
Trich, Wet Prep: NONE SEEN
WBC, Wet Prep HPF POC: >10 — AB (ref <10)

## 2021-05-25 MED ORDER — FLUCONAZOLE 50 MG PO TABS
150.0000 mg | ORAL_TABLET | Freq: Once | ORAL | Status: AC
  Administered 2021-05-25: 150 mg via ORAL
  Filled 2021-05-25: qty 1

## 2021-05-25 MED ORDER — ZOLPIDEM TARTRATE 5 MG PO TABS: 5.0000 mg | ORAL_TABLET | Freq: Every evening | ORAL | Status: DC | PRN

## 2021-05-25 MED ORDER — ACETAMINOPHEN 325 MG PO TABS
650.0000 mg | ORAL_TABLET | ORAL | Status: DC | PRN
  Administered 2021-05-25: 650 mg via ORAL
  Filled 2021-05-25: qty 2

## 2021-05-25 MED ORDER — DOCUSATE SODIUM 100 MG PO CAPS: 100.0000 mg | ORAL_CAPSULE | Freq: Every day | ORAL | Status: DC

## 2021-05-25 MED ORDER — CALCIUM CARBONATE ANTACID 500 MG PO CHEW: 2.0000 | CHEWABLE_TABLET | ORAL | Status: DC | PRN

## 2021-05-25 MED ORDER — PRENATAL MULTIVITAMIN CH: 1.0000 | ORAL_TABLET | Freq: Every day | ORAL | Status: DC

## 2021-05-25 NOTE — Progress Notes (Signed)
Discharge instructions provided to patient. Patient verbalized understanding. Pt educated on signs and symptoms of labor, vaginal bleeding, LOF, and fetal movement. Red flag signs reviewed by RN. Patient discharged home with significant other in stable condition.  

## 2021-05-25 NOTE — OB Triage Note (Signed)
Patient is a 26 yo, G2P0, at 38 weeks 0 days. Patient presents with complaints of contractions that started at approximately 1800 on 05/24/21 and have progressively intensified. Patient states the contractions are 3-4 minutes apart and rates them 10/10. Patient reports a strong feeling of pressure and urge to push. Patient denies any vaginal bleeding or symptoms consistent with LOF. Patient reports +FM.  SVE 1/50/ballottable. Monitors applied and assessing. VSS. Initial fetal heart tone 135. Will continue to monitor.   ?

## 2021-05-27 NOTE — Discharge Summary (Signed)
Chelsea Underwood is a 26 y.o. female. She is at [redacted]w[redacted]d gestation. Patient's last menstrual period was 08/31/2020. ?Estimated Date of Delivery: 06/08/21 ? ?Prenatal care site: [facility] ? ?Chief complaint: uterine contractions and urge to push ? ?HPI: Chelsea Underwood presents to L&D with complaints of uterine contractions ? ?Factors complicating pregnancy: ?1. Prefers blood products from family members ?2. Elevated 1hr GTT ?3. Obesity in pregnancy - BMI 36  ?4. Uterine S>D ?5. H/o mental health diagnoses ?6. Asthma ?7. Eye condition, swelling of the retina. Cleared by eye doctor at beginning of this year ? ?S: Resting comfortably. no CTX, no VB.no LOF,  Active fetal movement.  ? ?Maternal Medical History:  ?Past Medical Hx:  has a past medical history of Asthma.   ? ?Past Surgical Hx:  has a past surgical history that includes Xi robotic laparoscopic assisted appendectomy (N/A, 12/22/2020).  ? ?Allergies  ?Allergen Reactions  ? Amoxicillin Anaphylaxis and Itching  ? Cefdinir Anaphylaxis, Shortness Of Breath and Swelling  ?  Other reaction(s): Facial Swelling, Wheezing  ? Other Anaphylaxis  ?  Tree Nuts  ? Peanut Butter Flavor Anaphylaxis  ? Peanut Oil Anaphylaxis and Shortness Of Breath  ? Penicillin V Anaphylaxis  ? Apple Juice Itching  ? Peanut-Containing Drug Products   ? Penicillins   ?  ? ?Prior to Admission medications   ?Medication Sig Start Date End Date Taking? Authorizing Provider  ?Prenatal 28-0.8 MG TABS Take 1 tablet by mouth daily.   Yes [provider]  ?albuterol (VENTOLIN HFA) 108 (90 Base) MCG/ACT inhaler Inhale 2 puffs into the lungs every 6 (six) hours as needed. ?Patient not taking: Reported on 05/18/2021 08/22/15 07/15/21  [provider]  ?aspirin EC 81 MG tablet Take 81 mg by mouth daily. Swallow whole. ?Patient not taking: Reported on 03/24/2021    [provider]  ?docusate sodium (COLACE) 100 MG capsule Take 1 capsule (100 mg total) by mouth 2 (two) times daily.  To keep stools soft ?Patient not taking: Reported on 05/18/2021 12/23/20   Christeen Douglas, MD  ?EPINEPHrine 0.3 mg/0.3 mL IJ SOAJ injection epinephrine 0.3 mg/0.3 mL injection, auto-injector ? INJECT 0.3 ML (0.3 MG TOTAL) UNDER THE SKIN ONCE FOR 1 DOSE. 02/05/17   [provider]  ? ? ?Social History: She  reports that she has never smoked. She has never used smokeless tobacco. She reports current alcohol use. She reports that she does not currently use drugs. ? ?Family History: family history is not on file. ,no history of gyn cancers ? ?Review of Systems: A full review of systems was performed and negative except as noted in the HPI.   ? ?O: ? BP 115/60 (BP Location: Left Arm)   Pulse (!) 106   Temp 98.3 ?F (36.8 ?C) (Oral)   Resp 20   LMP 08/31/2020  ?Results for orders placed or performed during the hospital encounter of 05/25/21 (from the past 48 hour(s))  ?Wet prep, genital  ? Collection Time: 05/25/21  9:58 PM  ?Result Value Ref Range  ? Yeast Wet Prep HPF POC PRESENT (A) NONE SEEN  ? Trich, Wet Prep NONE SEEN NONE SEEN  ? Clue Cells Wet Prep HPF POC NONE SEEN NONE SEEN  ? WBC, Wet Prep HPF POC >10 (A) <10  ? Sperm NONE SEEN   ?  ? ? ?Constitutional: NAD, AAOx3  ?HE/ENT: extraocular movements grossly intact, moist mucous membranes ?CV: RRR ?PULM: nl respiratory effort ?Abd: gravid, non-tender, non-distended, soft  ?Ext: Non-tender,  Nonedmeatous ?Psych: mood appropriate, speech normal ?Pelvic : deferred ?SVE: Dilation: 1 ?Effacement (%): 50 ?Cervical Position: Middle ?Station: Ballotable ?Presentation: Vertex ?Exam by:: Rella Larve RN  ? ?Fetal Monitor: ?Baseline: 135 bpm ?Variability: moderate ?Accels: Present ?Decels: none ?Toco: irregular 2-8 mins ? ?Category: I ?NST: reactive ? ? ?Assessment: 26 y.o. [redacted]w[redacted]d here for antenatal surveillance during pregnancy for uterine contractions. Patient assessed for 2 hours without change in cervical dilation. ? ?Principle diagnosis: Uterine contractions  [O47.9]  ? ?Plan: ?Labor: not present.  ?NST reviewed  ?Fetal Wellbeing: Reassuring Cat 1 tracing. ?Reactive NST  ?D/c home stable, precautions reviewed, follow-up as scheduled.  ? ?----- ?Chari Manning, CNM ?Certified Nurse Midwife ?[facility] ?[facility]  ?  ?

## 2021-06-07 ENCOUNTER — Inpatient Hospital Stay: Payer: Medicaid Other | Admitting: Anesthesiology

## 2021-06-07 ENCOUNTER — Inpatient Hospital Stay
Admission: EM | Admit: 2021-06-07 | Discharge: 2021-06-09 | DRG: 807 | Disposition: A | Discharge status: Home / Self Care | Payer: Medicaid Other

## 2021-06-07 ENCOUNTER — Other Ambulatory Visit: Payer: Self-pay

## 2021-06-07 ENCOUNTER — Encounter: Payer: Self-pay | Admitting: Obstetrics and Gynecology

## 2021-06-07 DIAGNOSIS — O26893 Other specified pregnancy related conditions, third trimester: Secondary | ICD-10-CM | POA: Diagnosis present

## 2021-06-07 DIAGNOSIS — O4290 Premature rupture of membranes, unspecified as to length of time between rupture and onset of labor, unspecified weeks of gestation: Secondary | ICD-10-CM | POA: Diagnosis present

## 2021-06-07 DIAGNOSIS — Z3A39 39 weeks gestation of pregnancy: Secondary | ICD-10-CM

## 2021-06-07 DIAGNOSIS — O9952 Diseases of the respiratory system complicating childbirth: Secondary | ICD-10-CM | POA: Diagnosis present

## 2021-06-07 DIAGNOSIS — J45909 Unspecified asthma, uncomplicated: Secondary | ICD-10-CM | POA: Diagnosis present

## 2021-06-07 DIAGNOSIS — R198 Other specified symptoms and signs involving the digestive system and abdomen: Principal | ICD-10-CM | POA: Diagnosis present

## 2021-06-07 DIAGNOSIS — O4292 Full-term premature rupture of membranes, unspecified as to length of time between rupture and onset of labor: Secondary | ICD-10-CM | POA: Diagnosis present

## 2021-06-07 DIAGNOSIS — O99214 Obesity complicating childbirth: Secondary | ICD-10-CM | POA: Diagnosis present

## 2021-06-07 LAB — TYPE AND SCREEN
ABO/RH(D): O POS
Antibody Screen: NEGATIVE

## 2021-06-07 LAB — CBC
HCT: 41.6 % (ref 36.0–46.0)
Hemoglobin: 13.5 g/dL (ref 12.0–15.0)
MCH: 29.5 pg (ref 26.0–34.0)
MCHC: 32.5 g/dL (ref 30.0–36.0)
MCV: 90.8 fL (ref 80.0–100.0)
Platelets: 222 10*3/uL (ref 150–400)
RBC: 4.58 MIL/uL (ref 3.87–5.11)
RDW: 15.5 % (ref 11.5–15.5)
WBC: 13.6 10*3/uL — ABNORMAL HIGH (ref 4.0–10.5)
nRBC: 0 % (ref 0.0–0.2)

## 2021-06-07 LAB — RUPTURE OF MEMBRANE (ROM)PLUS: Rom Plus: POSITIVE

## 2021-06-07 MED ORDER — ONDANSETRON HCL 4 MG PO TABS: 4.0000 mg | ORAL_TABLET | ORAL | Status: DC | PRN

## 2021-06-07 MED ORDER — COCONUT OIL OIL: 1.0000 "application " | TOPICAL_OIL | Status: DC | PRN

## 2021-06-07 MED ORDER — TERBUTALINE SULFATE 1 MG/ML IJ SOLN
0.2500 mg | Freq: Once | INTRAMUSCULAR | Status: DC | PRN
Start: 2021-06-07 — End: 2021-06-07

## 2021-06-07 MED ORDER — LACTATED RINGERS IV SOLN
500.0000 mL | INTRAVENOUS | Status: DC | PRN
  Administered 2021-06-07: 500 mL via INTRAVENOUS

## 2021-06-07 MED ORDER — OXYTOCIN 10 UNIT/ML IJ SOLN
INTRAMUSCULAR | Status: AC
  Filled 2021-06-07: qty 2

## 2021-06-07 MED ORDER — EPHEDRINE 5 MG/ML INJ: 10.0000 mg | INTRAVENOUS | Status: DC | PRN

## 2021-06-07 MED ORDER — DIPHENHYDRAMINE HCL 50 MG/ML IJ SOLN: 12.5000 mg | INTRAMUSCULAR | Status: DC | PRN

## 2021-06-07 MED ORDER — SOD CITRATE-CITRIC ACID 500-334 MG/5ML PO SOLN: 30.0000 mL | ORAL | Status: DC | PRN

## 2021-06-07 MED ORDER — OXYTOCIN-SODIUM CHLORIDE 30-0.9 UT/500ML-% IV SOLN
2.5000 [IU]/h | INTRAVENOUS | Status: DC
  Administered 2021-06-07: 2.5 [IU]/h via INTRAVENOUS
  Filled 2021-06-07 (×2): qty 500

## 2021-06-07 MED ORDER — ACETAMINOPHEN 500 MG PO TABS
1000.0000 mg | ORAL_TABLET | Freq: Four times a day (QID) | ORAL | Status: DC | PRN
  Administered 2021-06-07 – 2021-06-08 (×3): 1000 mg via ORAL
  Filled 2021-06-07 (×3): qty 2

## 2021-06-07 MED ORDER — PRENATAL MULTIVITAMIN CH
1.0000 | ORAL_TABLET | Freq: Every day | ORAL | Status: DC
Start: 2021-06-08 — End: 2021-06-09
  Administered 2021-06-08: 1 via ORAL
  Filled 2021-06-07: qty 1

## 2021-06-07 MED ORDER — LACTATED RINGERS IV SOLN: INTRAVENOUS | Status: DC

## 2021-06-07 MED ORDER — ONDANSETRON HCL 4 MG/2ML IJ SOLN: 4.0000 mg | Freq: Four times a day (QID) | INTRAMUSCULAR | Status: DC | PRN

## 2021-06-07 MED ORDER — LIDOCAINE HCL (PF) 1 % IJ SOLN
INTRAMUSCULAR | Status: DC | PRN
  Administered 2021-06-07: 3 mL

## 2021-06-07 MED ORDER — BENZOCAINE-MENTHOL 20-0.5 % EX AERO
1.0000 | INHALATION_SPRAY | CUTANEOUS | Status: DC | PRN
Start: 2021-06-07 — End: 2021-06-09

## 2021-06-07 MED ORDER — OXYCODONE-ACETAMINOPHEN 5-325 MG PO TABS: 2.0000 | ORAL_TABLET | ORAL | Status: DC | PRN

## 2021-06-07 MED ORDER — IBUPROFEN 600 MG PO TABS
600.0000 mg | ORAL_TABLET | Freq: Four times a day (QID) | ORAL | Status: DC
  Administered 2021-06-07 – 2021-06-09 (×7): 600 mg via ORAL
  Filled 2021-06-07 (×7): qty 1

## 2021-06-07 MED ORDER — LACTATED RINGERS IV SOLN
500.0000 mL | Freq: Once | INTRAVENOUS | Status: AC
  Administered 2021-06-07: 500 mL via INTRAVENOUS

## 2021-06-07 MED ORDER — LIDOCAINE-EPINEPHRINE (PF) 1.5 %-1:200000 IJ SOLN
INTRAMUSCULAR | Status: DC | PRN
Start: 2021-06-07 — End: 2021-06-07
  Administered 2021-06-07: 3 mL via PERINEURAL

## 2021-06-07 MED ORDER — PHENYLEPHRINE 40 MCG/ML (10ML) SYRINGE FOR IV PUSH (FOR BLOOD PRESSURE SUPPORT): 80.0000 ug | PREFILLED_SYRINGE | INTRAVENOUS | Status: DC | PRN

## 2021-06-07 MED ORDER — FENTANYL-BUPIVACAINE-NACL 0.5-0.125-0.9 MG/250ML-% EP SOLN
EPIDURAL | Status: AC
  Filled 2021-06-07: qty 250

## 2021-06-07 MED ORDER — SIMETHICONE 80 MG PO CHEW: 80.0000 mg | CHEWABLE_TABLET | ORAL | Status: DC | PRN

## 2021-06-07 MED ORDER — OXYTOCIN-SODIUM CHLORIDE 30-0.9 UT/500ML-% IV SOLN
1.0000 m[IU]/min | INTRAVENOUS | Status: DC
  Administered 2021-06-07: 2 m[IU]/min via INTRAVENOUS

## 2021-06-07 MED ORDER — BUPIVACAINE HCL (PF) 0.25 % IJ SOLN
INTRAMUSCULAR | Status: DC | PRN
Start: 2021-06-07 — End: 2021-06-07
  Administered 2021-06-07: 6 mL via EPIDURAL

## 2021-06-07 MED ORDER — OXYTOCIN BOLUS FROM INFUSION
333.0000 mL | Freq: Once | INTRAVENOUS | Status: AC
  Administered 2021-06-07: 333 mL via INTRAVENOUS

## 2021-06-07 MED ORDER — DOCUSATE SODIUM 100 MG PO CAPS
100.0000 mg | ORAL_CAPSULE | Freq: Two times a day (BID) | ORAL | Status: DC
Start: 2021-06-07 — End: 2021-06-09
  Administered 2021-06-07 – 2021-06-08 (×3): 100 mg via ORAL
  Filled 2021-06-07 (×3): qty 1

## 2021-06-07 MED ORDER — DIPHENHYDRAMINE HCL 25 MG PO CAPS: 25.0000 mg | ORAL_CAPSULE | Freq: Four times a day (QID) | ORAL | Status: DC | PRN

## 2021-06-07 MED ORDER — DIBUCAINE (PERIANAL) 1 % EX OINT: 1.0000 "application " | TOPICAL_OINTMENT | CUTANEOUS | Status: DC | PRN

## 2021-06-07 MED ORDER — FENTANYL-BUPIVACAINE-NACL 0.5-0.125-0.9 MG/250ML-% EP SOLN
12.0000 mL/h | EPIDURAL | Status: DC | PRN
  Administered 2021-06-07: 12 mL/h via EPIDURAL

## 2021-06-07 MED ORDER — ACETAMINOPHEN 325 MG PO TABS
650.0000 mg | ORAL_TABLET | ORAL | Status: DC | PRN
  Administered 2021-06-07: 650 mg via ORAL
  Filled 2021-06-07: qty 2

## 2021-06-07 MED ORDER — WITCH HAZEL-GLYCERIN EX PADS
1.0000 | MEDICATED_PAD | CUTANEOUS | Status: DC
Start: 2021-06-07 — End: 2021-06-09

## 2021-06-07 MED ORDER — LIDOCAINE HCL (PF) 1 % IJ SOLN
30.0000 mL | INTRAMUSCULAR | Status: DC | PRN
  Filled 2021-06-07: qty 30

## 2021-06-07 MED ORDER — MISOPROSTOL 200 MCG PO TABS
ORAL_TABLET | ORAL | Status: AC
  Filled 2021-06-07: qty 4

## 2021-06-07 MED ORDER — BUTORPHANOL TARTRATE 1 MG/ML IJ SOLN
1.0000 mg | INTRAMUSCULAR | Status: DC | PRN
  Administered 2021-06-07: 1 mg via INTRAVENOUS
  Filled 2021-06-07: qty 1

## 2021-06-07 MED ORDER — OXYCODONE-ACETAMINOPHEN 5-325 MG PO TABS: 1.0000 | ORAL_TABLET | ORAL | Status: DC | PRN

## 2021-06-07 MED ORDER — ONDANSETRON HCL 4 MG/2ML IJ SOLN
4.0000 mg | INTRAMUSCULAR | Status: DC | PRN
Start: 2021-06-07 — End: 2021-06-09

## 2021-06-07 MED ORDER — AMMONIA AROMATIC IN INHA
RESPIRATORY_TRACT | Status: AC
  Filled 2021-06-07: qty 10

## 2021-06-07 NOTE — Progress Notes (Signed)
Pt. Expressed to RN discomfort with bands needed to keep [place] and toco on properly. RN having difficulty tracing fetal HR- pt frequently changing positions. RN at bedside for multiple monitor adjustments. Pt educated on importance of fetal monitoring.  ?Pt reports pain 10/10-requesting pain medication at this time. CNM notified. Pt encouraged to ambulate.  ?Will reassess ability to give pain medication with adequate FHR tracing.  ?

## 2021-06-07 NOTE — Lactation Note (Signed)
This note was copied from a baby's chart. ?Lactation Consultation Note ? ?Patient Name: Chelsea Underwood ?Today's Date: 06/07/2021 ?Reason for consult: L&D Initial assessment;Difficult latch;1st time breastfeeding ?Age:25 hours ?Lactation to L&D room for initial visit. Mother is holding the baby skin to skin. Encouraged feeding on demand and with cues. If baby is not cueing encouraged hand expression and skin to skin. Taught proper technique for hand expression and spoon feeding. LC and Mother expressed and fed to baby. Baby tolerated spoon feed well. Baby was fussy at breast. Attempted latch with and without nipple shield. He was able to maintain with both methods but remained fussy, so completed spoon feed and left him STS.Baby was left skin to skin with Mother. Taught proper placement of nipple shield and encouraged to try breast first without shield. Encouraged 8 or more attempts in the first 24 hours and 8 or more good feeds after 24 HOL. Reviewed appropriate diapers for days of life and How to know your baby is getting enough to eat. Reviewed "Understanding Postpartum and Newborn Care" booklet at bedside. Mother has no further questions at this time.  ? ?Maternal Data ?Has patient been taught Hand Expression?: Yes ?Does the patient have breastfeeding experience prior to this delivery?: No ? ?Feeding ?Mother's Current Feeding Choice: Breast Milk ? ?LATCH Score ?Latch: Repeated attempts needed to sustain latch, nipple held in mouth throughout feeding, stimulation needed to elicit sucking reflex. ? ?Audible Swallowing: A few with stimulation ? ?Type of Nipple: Flat ? ?Comfort (Breast/Nipple): Soft / non-tender ? ?Hold (Positioning): Full assist, staff holds infant at breast ? ?LATCH Score: 5 ? ? ?Lactation Tools Discussed/Used ?Tools: Nipple Dorris Carnes;Other (comment) (spoon) ?Nipple shield size: 20 ? ?Interventions ?Interventions: Breast feeding basics reviewed;Assisted with latch;Breast massage;Hand  express;Adjust position;Support pillows;Position options;Education;Expressed milk ? ?Discharge ?  ? ?Consult Status ?Consult Status: Follow-up ? ? ? ?[name] D Zyliah Schier ?06/07/2021, 4:24 PM ? ? ? ?

## 2021-06-07 NOTE — Discharge Instructions (Signed)
Vaginal Delivery, Care After Refer to this sheet in the next few weeks. These discharge instructions provide you with information on caring for yourself after delivery. Your caregiver may also give you specific instructions. Your treatment has been planned according to the most current medical practices available, but problems sometimes occur. Call your caregiver if you have any problems or questions after you go home. HOME CARE INSTRUCTIONS Take over-the-counter or prescription medicines only as directed by your caregiver or pharmacist. Do not drink alcohol, especially if you are breastfeeding or taking medicine to relieve pain. Do not smoke tobacco. Continue to use good perineal care. Good perineal care includes: Wiping your perineum from back to front Keeping your perineum clean. You can do sitz baths twice a day, to help keep this area clean Do not use tampons, douche or have sex until your caregiver says it is okay. Shower only and avoid sitting in submerged water, aside from sitz baths Wear a well-fitting bra that provides breast support. Eat healthy foods. Drink enough fluids to keep your urine clear or pale yellow. Eat high-fiber foods such as whole grain cereals and breads, brown rice, beans, and fresh fruits and vegetables every day. These foods may help prevent or relieve constipation. Avoid constipation with high fiber foods or medications, such as miralax or metamucil Follow your caregiver's recommendations regarding resumption of activities such as climbing stairs, driving, lifting, exercising, or traveling. Talk to your caregiver about resuming sexual activities. Resumption of sexual activities is dependent upon your risk of infection, your rate of healing, and your comfort and desire to resume sexual activity. Try to have someone help you with your household activities and your newborn for at least a few days after you leave the hospital. Rest as much as possible. Try to rest or  take a nap when your newborn is sleeping. Increase your activities gradually. Keep all of your scheduled postpartum appointments. It is very important to keep your scheduled follow-up appointments. At these appointments, your caregiver will be checking to make sure that you are healing physically and emotionally. SEEK MEDICAL CARE IF:  You are passing large clots from your vagina. Save any clots to show your caregiver. You have a foul smelling discharge from your vagina. You have trouble urinating. You are urinating frequently. You have pain when you urinate. You have a change in your bowel movements. You have increasing redness, pain, or swelling near your vaginal incision (episiotomy) or vaginal tear. You have pus draining from your episiotomy or vaginal tear. Your episiotomy or vaginal tear is separating. You have painful, hard, or reddened breasts. You have a severe headache. You have blurred vision or see spots. You feel sad or depressed. You have thoughts of hurting yourself or your newborn. You have questions about your care, the care of your newborn, or medicines. You are dizzy or light-headed. You have a rash. You have nausea or vomiting. You were breastfeeding and have not had a menstrual period within 12 weeks after you stopped breastfeeding. You are not breastfeeding and have not had a menstrual period by the 12th week after delivery. You have a fever. SEEK IMMEDIATE MEDICAL CARE IF:  You have persistent pain. You have chest pain. You have shortness of breath. You faint. You have leg pain. You have stomach pain. Your vaginal bleeding saturates two or more sanitary pads in 1 hour. MAKE SURE YOU:  Understand these instructions. Will watch your condition. Will get help right away if you are not doing well or   get worse. Document Released: 02/07/2000 Document Revised: 06/26/2013 Document Reviewed: 10/07/2011 Exit[employer] Patient Information 2015 [employer]. This  information is not intended to replace advice given to you by your health care provider. Make sure you discuss any questions you have with your health care provider.  Sitz Bath A sitz bath is a warm water bath taken in the sitting position. The water covers only the hips and butt (buttocks). We recommend using one that fits in the toilet, to help with ease of use and cleanliness. It may be used for either healing or cleaning purposes. Sitz baths are also used to relieve pain, itching, or muscle tightening (spasms). The water may contain medicine. Moist heat will help you heal and relax.  HOME CARE  Take 3 to 4 sitz baths a day. Fill the bathtub half-full with warm water. Sit in the water and open the drain a little. Turn on the warm water to keep the tub half-full. Keep the water running constantly. Soak in the water for 15 to 20 minutes. After the sitz bath, pat the affected area dry. GET HELP RIGHT AWAY IF: You get worse instead of better. Stop the sitz baths if you get worse. MAKE SURE YOU: Understand these instructions. Will watch your condition. Will get help right away if you are not doing well or get worse. Document Released: 03/19/2004 Document Revised: 11/04/2011 Document Reviewed: 06/09/2010 Exit[employer] Patient Information 2015 [employer]. This information is not intended to replace advice given to you by your health care provider. Make sure you discuss any questions you have with your health care provider.   

## 2021-06-07 NOTE — Progress Notes (Signed)
Labor Progress Note ? ?Chelsea Underwood is a 26 y.o. G2P0010 at [redacted]w[redacted]d by LMP admitted for rupture of membranes ? ?Subjective: feeling more uncomfortable  ? ?Objective: ?BP 132/86   Pulse 76   Temp 98.1 ?F (36.7 ?C) (Oral)   Resp 18   Ht 5\' 3"  (1.6 m)   Wt 108 kg   LMP 08/31/2020   BMI 42.16 kg/m?  ?Notable VS details: reviewed  ? ?Fetal Assessment: ?FHT:  FHR: 120 bpm, variability: moderate,  accelerations:  Present,  decelerations:  Absent ?Category/reactivity:  Category I ?UC:   regular, every 2-4 minutes ?SVE:   1-2/70/-2 ?Membrane status: PROM at 0100 ?Amniotic color: Clear  ? ?Labs: ?Lab Results  ?Component Value Date  ? WBC 20.1 (H) 12/29/2020  ? HGB 12.8 12/29/2020  ? HCT 38.8 12/29/2020  ? MCV 94.6 12/29/2020  ? PLT 265 12/29/2020  ? ? ?Assessment / Plan: ?Augmentation of labor, progressing well ? ?Labor: Progressing normally ?Fetal Wellbeing:  Category I ?Pain Control:  IV pain meds - requesting medication ?I/D:   ROM x 8 hours, afebrile, GBS neg ?Anticipated MOD:  NSVD ? ?13/07/2020, CNM ?06/07/2021, 8:48 AM ? ? ? ? ? ? ? ? ?

## 2021-06-07 NOTE — Anesthesia Procedure Notes (Signed)
Epidural ?Patient location during procedure: OB ?Start time: 06/07/2021 11:26 AM ?End time: 06/07/2021 11:31 AM ? ?Staffing ?Anesthesiologist: Karleen Hampshire, MD ?Performed: anesthesiologist  ? ?Preanesthetic Checklist ?Completed: patient identified, IV checked, site marked, risks and benefits discussed, surgical consent, monitors and equipment checked, pre-op evaluation and timeout performed ? ?Epidural ?Patient position: sitting ?Prep: ChloraPrep ?Patient monitoring: heart rate, continuous pulse ox and blood pressure ?Approach: midline ?Location: L4-L5 ?Injection technique: LOR saline ? ?Needle:  ?Needle type: Tuohy  ?Needle gauge: 18 G ?Needle length: 9 cm and 9 ?Needle insertion depth: 7 cm ?Catheter type: closed end flexible ?Catheter size: 20 Guage ?Catheter at skin depth: 12 cm ?Test dose: negative and 1.5% lidocaine with Epi 1:200 K ? ?Assessment ?Events: blood not aspirated, injection not painful, no injection resistance, no paresthesia and negative IV test ? ?Additional Notes ? ? ?Patient tolerated the insertion well without complications.Reason for block:procedure for pain ? ? ? ?

## 2021-06-07 NOTE — OB Triage Note (Signed)
Pt states that about 20-30 minutes ago she went to the bathroom and felt a gush of fluid and mucous. Pt states that she has been having cramps all day on and off.  ?

## 2021-06-07 NOTE — Anesthesia Preprocedure Evaluation (Addendum)
Anesthesia Evaluation  ?Patient identified by MRN, date of birth, ID band ?Patient awake ? ? ? ?Reviewed: ?Allergy & Precautions, H&P , NPO status , Patient's Chart, lab work & pertinent test results ? ?Airway ?Mallampati: III ? ?TM Distance: >3 FB ?Neck ROM: full ? ? ? Dental ?  ?Pulmonary ?asthma ,  ?  ?Pulmonary exam normal ? ? ? ? ? ? ? Cardiovascular ?Exercise Tolerance: Good ?(-) hypertensionnegative cardio ROS ?Normal cardiovascular exam ? ? ?  ?Neuro/Psych ? Headaches,   ? GI/Hepatic ?negative GI ROS,   ?Endo/Other  ?Morbid obesity ? Renal/GU ?  ?negative genitourinary ?  ?Musculoskeletal ? ? Abdominal ?  ?Peds ? Hematology ?negative hematology ROS ?(+)   ?Anesthesia Other Findings ?Past Medical History: ?No date: Asthma ? ?Past Surgical History: ?12/22/2020: XI ROBOTIC LAPAROSCOPIC ASSISTED APPENDECTOMY; N/A ?    Comment:  Procedure: XI ROBOTIC LAPAROSCOPIC ASSISTED  ?             APPENDECTOMY;  Surgeon: Carolan Shiver, MD;   ?             Location: [facility] ORS;  Service: General;  Laterality: N/A; ? ?BMI   ? Body Mass Index: 42.16 kg/m?  ?  ? ? Reproductive/Obstetrics ?(+) Pregnancy ? ?  ? ? ? ? ? ? ? ? ? ? ? ? ? ?  ?  ? ? ? ? ? ? ? ? ?Anesthesia Physical ?Anesthesia Plan ? ?ASA: 3 ? ?Anesthesia Plan: Epidural  ? ?Post-op Pain Management:   ? ?Induction:  ? ?PONV Risk Score and Plan:  ? ?Airway Management Planned:  ? ?Additional Equipment:  ? ?Intra-op Plan:  ? ?Post-operative Plan:  ? ?Informed Consent: I have reviewed the patients History and Physical, chart, labs and discussed the procedure including the risks, benefits and alternatives for the proposed anesthesia with the patient or authorized representative who has indicated his/her understanding and acceptance.  ? ? ? ? ? ?Plan Discussed with: Anesthesiologist ? ?Anesthesia Plan Comments:   ? ? ? ? ? ?Anesthesia Quick Evaluation ? ?

## 2021-06-07 NOTE — Plan of Care (Signed)
?  Problem: Education: ?Goal: Knowledge of General Education information will improve ?Description: Including pain rating scale, medication(s)/side effects and non-pharmacologic comfort measures ?Outcome: Progressing ?  ?Problem: Clinical Measurements: ?Goal: Will remain free from infection ?Outcome: Progressing ?  ?Problem: Pain Managment: ?Goal: General experience of comfort will improve ?Outcome: Progressing ?  ?Problem: Safety: ?Goal: Ability to remain free from injury will improve ?Outcome: Progressing ?  ?Problem: Education: ?Goal: Individualized Newborn Educational Video(s) ?Outcome: Progressing ?  ?Problem: Education: ?Goal: Individualized Newborn Educational Video(s) ?Outcome: Progressing ?  ?Problem: Activity: ?Goal: Will verbalize the importance of balancing activity with adequate rest periods ?Outcome: Progressing ?  ?

## 2021-06-07 NOTE — H&P (Signed)
OB History & Physical  ? ?History of Present Illness:  ?Chief Complaint:  ? ?HPI:  ?Chelsea Underwood is a 26 y.o. G14P0010 female at [redacted]w[redacted]d dated by LMP.  She presents to L&D for leaking fluid. ? ?She reports:  ?-active fetal movement ?-LOF/SROM at 0100 ?-no vaginal bleeding ?-no contractions ? ?Pregnancy Issues: ?1. Prefers blood products from family members - did not get family donations  ?2. Obesity in pregnancy - BMI 36  ?3. Uterine S>D ?? 04/29/21: EFW= 5lb8oz(2483g)= 67% ?4. H/o mental health diagnoses ?5. Asthma ?6. Eye condition, swelling of the retina. Cleared by eye doctor at beginning of this year ? ? ?Maternal Medical History:  ? ?Past Medical History:  ?Diagnosis Date  ? Asthma   ? ? ?Past Surgical History:  ?Procedure Laterality Date  ? XI ROBOTIC LAPAROSCOPIC ASSISTED APPENDECTOMY N/A 12/22/2020  ? Procedure: XI ROBOTIC LAPAROSCOPIC ASSISTED APPENDECTOMY;  Surgeon: Carolan Shiver, MD;  Location: [facility] ORS;  Service: General;  Laterality: N/A;  ? ? ?Allergies  ?Allergen Reactions  ? Amoxicillin Anaphylaxis and Itching  ? Cefdinir Anaphylaxis, Shortness Of Breath and Swelling  ?  Other reaction(s): Facial Swelling, Wheezing  ? Other Anaphylaxis  ?  Tree Nuts  ? Peanut Butter Flavor Anaphylaxis  ? Peanut Oil Anaphylaxis and Shortness Of Breath  ? Penicillin V Anaphylaxis  ? Apple Juice Itching  ? Peanut-Containing Drug Products   ? Penicillins   ? ? ?Prior to Admission medications   ?Medication Sig Start Date End Date Taking? Authorizing Provider  ?albuterol (VENTOLIN HFA) 108 (90 Base) MCG/ACT inhaler Inhale 2 puffs into the lungs every 6 (six) hours as needed. ?Patient not taking: Reported on 05/18/2021 08/22/15 07/15/21  [provider]  ?aspirin EC 81 MG tablet Take 81 mg by mouth daily. Swallow whole. ?Patient not taking: Reported on 03/24/2021    [provider]  ?EPINEPHrine 0.3 mg/0.3 mL IJ SOAJ injection epinephrine 0.3 mg/0.3 mL injection, auto-injector ? INJECT 0.3 ML (0.3  MG TOTAL) UNDER THE SKIN ONCE FOR 1 DOSE. 02/05/17   [provider]  ?Prenatal 28-0.8 MG TABS Take 1 tablet by mouth daily.    [provider]  ? ? ? ?Prenatal care site: [facility] ? ?Social History: She  reports that she has never smoked. She has never used smokeless tobacco. She reports current alcohol use. She reports that she does not currently use drugs. ? ?Family History: family history is not on file.  ? ?Review of Systems: A full review of systems was performed and negative except as noted in the HPI.   ? ?Physical Exam:  ?Vital Signs: BP 125/68 (BP Location: Right Arm)   Pulse 90   Temp 98.5 ?F (36.9 ?C) (Oral)   Resp 18   Ht 5\' 3"  (1.6 m)   Wt 108 kg   LMP 08/31/2020   BMI 42.16 kg/m?  ? ?General:   alert and cooperative  ?Skin:  normal  ?Neurologic:    Alert & oriented x 3  ?Lungs:    Nl effort  ?Heart:   regular rate and rhythm  ?Abdomen:  soft, non-tender; bowel sounds normal; no masses,  no organomegaly  ?Extremities: : non-tender, symmetric, no edema bilaterally.     ? ?EFW: 04/29/21: 5lb8oz(2483g)= 67% ? ?Results for orders placed or performed during the hospital encounter of 06/07/21 (from the past 24 hour(s))  ?ROM Plus ([facility] only)     Status: None  ? Collection Time: 06/07/21  1:37 AM  ?Result Value Ref  Range  ? Rom Plus POSITIVE   ? ? ?Pertinent Results:  ?Prenatal Labs: ?Blood type/Rh O pos  ?Antibody screen neg  ?Rubella Immune  ?Varicella Immune  ?RPR NR  ?HBsAg Neg  ?HIV NR  ?GC neg  ?Chlamydia neg  ?Genetic screening negative  ?1 hour GTT 178  ?3 hour GTT 87, 187, 133, 132  ?GBS neg  ? ?FHT: FHR: 145 bpm, variability: moderate,  accelerations:  Present,  decelerations:  Absent ?Category/reactivity:  Category I ?TOCO: none ?SVE: Dilation: 1 / Effacement (%): 50 / Station: Ballotable  ?  ? ?Assessment:  ?Chelsea Underwood is a 26 y.o. G21P0010 female at [redacted]w[redacted]d with leaking fluid..  ? ?Plan:  ?1. Admit to Labor & Delivery; consents reviewed and obtained ? ?2.  Fetal Well being  ?- Fetal Tracing: cat I ?- GBS neg ?- Presentation: vtx confirmed by sve  ? ?3. Routine OB: ?- Prenatal labs reviewed, as above ?- Rh pos ?- CBC & T&S on admit ?- Clear fluids, IVF ? ?4. Induction of Labor ?-  Contractions by external toco in place ?-  Plan for induction with Pitocin ?-  Plan for continuous fetal monitoring  ?-  Maternal pain control as desired: IVPM, nitrous, regional anesthesia ?- Anticipate vaginal delivery ? ?5. Post Partum Planning: ?- Infant feeding: Breastfeeding ?- Contraception: undecided-does not do well with hormones ? ?Chelsea Underwood, CNM ?06/07/2021 6:32 AM ? ?  ?

## 2021-06-07 NOTE — Progress Notes (Signed)
[place] tracing maternal HR- RN at bedside with multiple attempts to adjust. ?1121-monitors removed during epidural procedure.  ?

## 2021-06-07 NOTE — Discharge Summary (Signed)
Obstetrical Discharge Summary ? ?Patient Name: Chelsea Underwood ?DOB: 03-18-1995 ?MRN: [id number] ? ?Date of Admission: 06/07/2021 ?Date of Delivery: 06/07/2021 ?Delivered by: Drinda Butts, CNM  ?Date of Discharge: 06/09/2021 ? ?Primary OB: [facility] ?[id number] last menstrual period was 09/01/2020 (exact date). ?EDC Estimated Date of Delivery: 06/08/21 ?Gestational Age at Delivery: [redacted]w[redacted]d  ? ?Antepartum complications:  ?1. Prefers blood products from family members - did not get family donations  ?2. Obesity in pregnancy - BMI 36  ?3. Uterine S>D ?? 04/29/21: EFW= 5lb8oz(2483g)= 67% ?4. H/o mental health diagnoses ?5. Asthma ?6. Eye condition, swelling of the retina. Cleared by eye doctor at beginning of this year ? ?Admitting Diagnosis: Abdominal complaints [R19.8] ?PROM (premature rupture of membranes) [O42.90]  ?Secondary Diagnosis: ?Patient Active Problem List  ? Diagnosis Date Noted  ? Abdominal complaints 06/07/2021  ? PROM (premature rupture of membranes) 06/07/2021  ? Vaginal discharge during pregnancy in third trimester 05/18/2021  ? Uterine contractions 04/15/2021  ? Acute abdominal pain in right lower quadrant 12/21/2020  ? Encounter for supervision of other normal pregnancy, third trimester 11/20/2020  ? Migraine 02/23/2018  ? Asthma 05/18/2016  ? ? ?Augmentation: Pitocin ?Complications: None ?Intrapartum complications/course: Chelsea Underwood presented to L&D with PROM.  She was augmented with oxytocin and received IVPM in early labor.  She progressed rapidly from 3cm tp C/C/+2 with an urge to push after receiving an epidural.  She pushed effectively over approximately 50 minutes for a spontaneous vaginal birth ?Delivery Type: spontaneous vaginal delivery ?Anesthesia: epidural ?Placenta: spontaneous ?Laceration: 1st degree vaginal laceration repaired with 2-0 vicryl CT ?Episiotomy: none ?Newborn Data: ?Live born female "Dakari" ?Birth Weight:  6#15 ?APGAR: 9, 9 ? ?Newborn Delivery   ?Birth date/time:  06/07/2021 14:08:00 ?Delivery type: Vaginal, Spontaneous ?  ? ? ?Postpartum Procedures: none ? ?Edinburgh:  ? ?  06/08/2021  ? 11:27 PM  ?Flavia Shipper Postnatal Depression Scale Screening Tool  ?I have been able to laugh and see the funny side of things. 0  ?I have looked forward with enjoyment to things. 0  ?I have blamed myself unnecessarily when things went wrong. 1  ?I have been anxious or worried for no good reason. 2  ?I have felt scared or panicky for no good reason. 2  ?Things have been getting on top of me. 2  ?I have been so unhappy that I have had difficulty sleeping. 0  ?I have felt sad or miserable. 1  ?I have been so unhappy that I have been crying. 0  ?The thought of harming myself has occurred to me. 0  ?Edinburgh Postnatal Depression Scale Total 8  ?  ? ?Post partum course:  ?Patient had an uncomplicated postpartum course.  By time of discharge on PPD#2, her pain was controlled on oral pain medications; she had appropriate lochia and was ambulating, voiding without difficulty and tolerating regular diet.  She was deemed stable for discharge to home.   ? ?Discharge Physical Exam:  ?BP 131/81 (BP Location: Left Arm)   Pulse 92   Temp 98.9 ?F (37.2 ?C) (Oral)   Resp 18   Ht 5\' 3"  (1.6 m)   Wt 108 kg   LMP 09/01/2020 (Exact Date)   SpO2 98%   Breastfeeding Unknown   BMI 42.16 kg/m?  ? ?General: NAD ?CV: RRR ?Pulm: CTABL, nl effort ?ABD: s/nd/nt, fundus firm and below the umbilicus ?Lochia: moderate ?Perineum:minimal edema/intact ?DVT Evaluation: LE non-ttp, no evidence of DVT on exam. ? ?Hemoglobin  ?Date Value Ref Range Status  ?  06/08/2021 12.0 12.0 - 15.0 g/dL Final  ? ?HCT  ?Date Value Ref Range Status  ?06/08/2021 36.8 36.0 - 46.0 % Final  ? ? ? ?Disposition: stable, discharge to home. ?Baby Feeding: breastmilk and formula ?Baby Disposition: home with mom ? ?Rh Immune globulin given: Rh pos ?Rubella vaccine given: Immune ?Varivax vaccine given: Immune  ?Flu vaccine given in AP or PP setting:  given 11/2020 ?Tdap vaccine given in AP or PP setting: declined  ? ?Contraception: TBD - considering IUD ? ?Prenatal Labs:  ?Blood type/Rh O pos  ?Antibody screen neg  ?Rubella Immune  ?Varicella Immune  ?RPR NR  ?HBsAg Neg  ?HIV NR  ?GC neg  ?Chlamydia neg  ?Genetic screening negative  ?1 hour GTT 178  ?3 hour GTT 87, 187, 133, 132  ?GBS neg  ? ? ?Plan:  ?Chelsea Underwood was discharged to home in good condition. ?Follow-up appointment with delivering provider in 2 weeks. ? ?Discharge Medications: ?Allergies as of 06/09/2021   ? ?   Reactions  ? Amoxicillin Anaphylaxis, Itching  ? Cefdinir Anaphylaxis, Shortness Of Breath, Swelling  ? Other reaction(s): Facial Swelling, Wheezing  ? Other Anaphylaxis  ? Tree Nuts  ? Peanut Butter Flavor Anaphylaxis  ? Peanut Oil Anaphylaxis, Shortness Of Breath  ? Penicillin V Anaphylaxis  ? Apple Juice Itching  ? Peanut-containing Drug Products   ? Penicillins   ? ?  ? ?  ?Medication List  ?  ? ?TAKE these medications   ? ?acetaminophen 500 MG tablet ?Commonly known as: TYLENOL ?Take 2 tablets (1,000 mg total) by mouth every 6 (six) hours as needed (for pain scale < 4). ?  ?benzocaine-Menthol 20-0.5 % Aero ?Commonly known as: DERMOPLAST ?Apply 1 application. topically as needed for irritation (perineal discomfort). ?  ?coconut oil Oil ?Apply 1 application. topically as needed. ?  ?diphenhydrAMINE 25 mg capsule ?Commonly known as: BENADRYL ?Take 1 capsule (25 mg total) by mouth every 6 (six) hours as needed for itching. ?  ?docusate sodium 100 MG capsule ?Commonly known as: COLACE ?Take 1 capsule (100 mg total) by mouth 2 (two) times daily. ?  ?EPINEPHrine 0.3 mg/0.3 mL Soaj injection ?Commonly known as: EPI-PEN ?epinephrine 0.3 mg/0.3 mL injection, auto-injector ? INJECT 0.3 ML (0.3 MG TOTAL) UNDER THE SKIN ONCE FOR 1 DOSE. ?  ?ibuprofen 600 MG tablet ?Commonly known as: ADVIL ?Take 1 tablet (600 mg total) by mouth every 6 (six) hours. ?  ?Prenatal 28-0.8 MG Tabs ?Take 1 tablet  by mouth daily. ?  ?simethicone 80 MG chewable tablet ?Commonly known as: MYLICON ?Chew 1 tablet (80 mg total) by mouth as needed for flatulence. ?  ?witch hazel-glycerin pad ?Commonly known as: TUCKS ?Apply 1 application. topically continuous. ?  ? ?  ? ? ? Follow-up Information   ? ? Minda Meo, CNM. Schedule an appointment as soon as possible for a visit in 2 week(s).   ?Specialty: Certified Nurse Midwife ?Why: postpartum mood check ?Contact information: ?[address] ?[place] [address] ?[phone] ? ? ?  ?  ? ? Minda Meo, CNM. Schedule an appointment as soon as possible for a visit in 6 week(s).   ?Specialty: Certified Nurse Midwife ?Why: postpartum visit ?Contact information: ?[address] ?[place] [address] ?[phone] ? ? ?  ?  ? ?  ?  ? ?  ? ? ?Signed: ?Francetta Found, CNM ?06/09/2021 ?12:27 PM ? ? ?

## 2021-06-07 NOTE — Progress Notes (Signed)
Labor Progress Note ? ?Chelsea Underwood is a 26 y.o. G2P0010 at [redacted]w[redacted]d by LMP admitted for rupture of membranes ? ?Subjective: feeling more pressure after epidural ? ?Objective: ?BP 129/81   Pulse 81   Temp 98 ?F (36.7 ?C) (Oral)   Resp 18   Ht 5\' 3"  (1.6 m)   Wt 108 kg   LMP 08/31/2020   SpO2 100%   BMI 42.16 kg/m?  ?Notable VS details: reviewed  ? ?Fetal Assessment: ?FHT:  FHR: 130 bpm, variability: moderate,  accelerations:  Present,  decelerations:  Present intermittent earlies and variables  ?Category/reactivity:  Category II ?UC:   regular, every 2 minutes ?SVE:   9.5/90/+2 ?Membrane status: SROM at 0100 ?Amniotic color: Clear  ? ?Labs: ?Lab Results  ?Component Value Date  ? WBC 13.6 (H) 06/07/2021  ? HGB 13.5 06/07/2021  ? HCT 41.6 06/07/2021  ? MCV 90.8 06/07/2021  ? PLT 222 06/07/2021  ? ? ?Assessment / Plan: ?Augmentation of labor, progressing well ? ?Labor: Progressing normally ?Fetal Wellbeing:  Category II - overall reassuring with moderate variability  ?Pain Control:  Epidural ?I/D:   ROM x 12 hours, afebrile, GBS neg ?Anticipated MOD:  NSVD ? ?Minda Meo, CNM ?06/07/2021, 1:04 PM ? ? ? ? ? ? ? ? ?

## 2021-06-08 LAB — CBC
HCT: 36.8 % (ref 36.0–46.0)
Hemoglobin: 12 g/dL (ref 12.0–15.0)
MCH: 29.8 pg (ref 26.0–34.0)
MCHC: 32.6 g/dL (ref 30.0–36.0)
MCV: 91.3 fL (ref 80.0–100.0)
Platelets: 216 10*3/uL (ref 150–400)
RBC: 4.03 MIL/uL (ref 3.87–5.11)
RDW: 15.6 % — ABNORMAL HIGH (ref 11.5–15.5)
WBC: 17.5 10*3/uL — ABNORMAL HIGH (ref 4.0–10.5)
nRBC: 0 % (ref 0.0–0.2)

## 2021-06-08 LAB — RPR: RPR Ser Ql: NONREACTIVE

## 2021-06-08 NOTE — Lactation Note (Signed)
This note was copied from a baby's chart. ?Lactation Consultation Note ? ?Patient Name: Chelsea Underwood ?Today's Date: 06/08/2021 ?Reason for consult: Follow-up assessment;1st time breastfeeding;Difficult latch ?Age:25 hours ?Lactation Rounds: ?LC to the room for a checkin.  Mother stated she just tried to feed baby a bottle. [facility] taught Mother side lying paced bottle feed.  Baby is slightly gaggy and LC explained feeding him on his side will help with the this. He did take 31ml's from the bottle.  ? Mother states feeds not going well at the breast. She tries to place him there with the nipple shield but he will only suck a few times and then falls asleep. Initiated pumping and encouraged pumping 8x's/24 hours.  Reviewed set up and cleaning of equipment with milk storage guidelines. Reviewed age appropriate volumes for days of life.  LC reviewed and encouraged feeding on demand and with cues. If baby is not cueing we encourage hand expression and spoon feed to wake baby. Reviewed diaper counts for days of life and when to call Peds with questions. Reviewed "understanding Postpartum and Newborn care " booklet at bedside. Reviewed outpatient Lactation number and resources. Mother. stated understanding with all teaching.  ? ?Maternal Data ?Has patient been taught Hand Expression?: Yes ? ?Feeding ?Mother's Current Feeding Choice: Breast Milk and Formula ?Nipple Type: Slow - flow ? ?Lactation Tools Discussed/Used ?Tools: Pump ?Breast pump type: Double-Electric Breast Pump (Symphony at bedside) ?Pump Education: Setup, frequency, and cleaning;Milk Storage ?Reason for Pumping: baby is notlkatching and feeding well at breast after 24 HOL ?Pumping frequency: encouraged to pump 8x's/24 hours ?Pumped volume:  (drops) ? ?Interventions ?Interventions: Breast feeding basics reviewed;Hand pump;DEBP;Education ? ?Discharge ?Pump: DEBP;Personal (has a pump at home) ? ?Consult Status ?Consult Status: Follow-up ? ? ? ?Arlicia Paquette D  Deanda Ruddell ?06/08/2021, 3:38 PM ? ? ? ?

## 2021-06-08 NOTE — Progress Notes (Signed)
Postpartum Day  1 ? ?Subjective: ?no complaints, up ad lib, voiding, and tolerating PO ? ?Doing well, no concerns. Ambulating without difficulty, pain managed with PO meds, tolerating regular diet, and voiding without difficulty.  ? ?No fever/chills, chest pain, shortness of breath, nausea/vomiting, or leg pain. No nipple or breast pain. No headache, visual changes, or RUQ/epigastric pain. ? ?Objective: ?BP (!) 112/55   Pulse 75   Temp 98.6 ?F (37 ?C) (Oral)   Resp 18   Ht 5\' 3"  (1.6 m)   Wt 108 kg   LMP 09/01/2020 (Exact Date)   SpO2 99%   Breastfeeding Unknown   BMI 42.16 kg/m?  ?  ?Physical Exam:  ?General: alert, cooperative, and no distress ?Breasts: soft/nontender, flat nipples with minimal eversion with stimulation  ?CV: RRR ?Pulm: nl effort, CTABL ?Abdomen: soft, non-tender, active bowel sounds ?Uterine Fundus: firm ?Perineum: minimal edema, repair well approximated ?Lochia: appropriate ?DVT Evaluation: No evidence of DVT seen on physical exam. ? ?Recent Labs  ?  06/07/21 ?0352 06/08/21 ?0625  ?HGB 13.5 12.0  ?HCT 41.6 36.8  ?WBC 13.6* 17.5*  ?PLT 222 216  ? ? ?Assessment/Plan: ?26 y.o. G2P1011 postpartum day # 1 ? ?-Continue routine postpartum care ?-Lactation consult PRN for breastfeeding.  ?-Discussed flat nipples and interventions for breastfeeding.  She is using a nipple shield and spoon feeding expressed milk.  Reviewed options for pumping and latching.  Recommend expressing breasts 8-12 times in 24 hours to establish and maintain milk supply.   ?-CBC reviewed - hemodynamically stable and asymptomatic ?-Immunization status:   all immunizations up to date ? ? ?Disposition: Continue inpatient postpartum care.  Plan for discharge tomorrow for feeding support.  Consider d/c later this evening if infant feedings have improved.   ? ? LOS: 1 day  ? ?22, CNM ?06/08/2021, 9:44 AM  ? ?----- ?06/10/2021  ?Certified Nurse Midwife ?[facility] ?[facility] ? ?

## 2021-06-08 NOTE — Lactation Note (Signed)
This note was copied from a baby's chart. ?Lactation Consultation Note ? ?Patient Name: Chelsea Underwood ?Today's Date: 06/08/2021 ?  ?Age:25 Hours ?LC to the room for rounds. Mother stated baby just came back from the circ and she fed him for a few minutes. LC reviewed and encouraged feeding on demand and with cues but also stated concern for the small amount of time that baby is at breast. Mother was encouraged to call prior to next feed for LC to assist and possible initiate supplement at the breast with pumping. Also encouraged to continue hand expression and spoon feeding.  ? ?Leoma Folds D Hadja Harral ?06/08/2021, 3:32 PM ? ? ? ?

## 2021-06-09 MED ORDER — SIMETHICONE 80 MG PO CHEW: 80.0000 mg | CHEWABLE_TABLET | ORAL | 0 refills | Status: AC | PRN

## 2021-06-09 MED ORDER — ACETAMINOPHEN 500 MG PO TABS: 1000.0000 mg | ORAL_TABLET | Freq: Four times a day (QID) | ORAL | 0 refills | Status: AC | PRN

## 2021-06-09 MED ORDER — IBUPROFEN 600 MG PO TABS: 600.0000 mg | ORAL_TABLET | Freq: Four times a day (QID) | ORAL | 0 refills | Status: AC

## 2021-06-09 MED ORDER — DIPHENHYDRAMINE HCL 25 MG PO CAPS: 25.0000 mg | ORAL_CAPSULE | Freq: Four times a day (QID) | ORAL | 0 refills | Status: AC | PRN

## 2021-06-09 MED ORDER — WITCH HAZEL-GLYCERIN EX PADS
1.0000 | MEDICATED_PAD | CUTANEOUS | 12 refills | Status: AC
Start: 2021-06-09 — End: ?

## 2021-06-09 MED ORDER — BENZOCAINE-MENTHOL 20-0.5 % EX AERO
1.0000 | INHALATION_SPRAY | CUTANEOUS | Status: AC | PRN
Start: 2021-06-09 — End: ?

## 2021-06-09 MED ORDER — COCONUT OIL OIL: 1.0000 "application " | TOPICAL_OIL | 0 refills | Status: AC | PRN

## 2021-06-09 MED ORDER — DOCUSATE SODIUM 100 MG PO CAPS
100.0000 mg | ORAL_CAPSULE | Freq: Two times a day (BID) | ORAL | 0 refills | Status: AC
Start: 2021-06-09 — End: ?

## 2021-06-09 NOTE — Progress Notes (Signed)
Patient discharged with infant. Discharge instructions, prescriptions, and follow up appointments given to and reviewed with patient. Patient verbalized understanding. Escorted out by axillary.  

## 2021-06-09 NOTE — Lactation Note (Signed)
This note was copied from a baby's chart. ?Lactation Consultation Note ? ?Patient Name: Chelsea Underwood ?Today's Date: 06/09/2021 ?Reason for consult: Follow-up assessment;Primapara;Term ?Age:26 years ? ?Lactation check-in before anticipated discharge. ? ?Maternal Data ?Has patient been taught Hand Expression?: Yes ?Does the patient have breastfeeding experience prior to this delivery?: No ? ?Feeding ?Mother's Current Feeding Choice: Breast Milk and Formula ?Nipple Type: Slow - flow ? ?Mom had desired breastfeeding however baby has had difficulty with consistent and strong latch/milk removal. Mom has provided EBM+formula. ? ?Lactation Tools Discussed/Used ?Tools: Pump;Nipple [place] Fuel;Bottle;Flanges;Coconut oil ?Nipple shield size: 20 ?Flange Size: 24 ?Breast pump type: Double-Electric Breast Pump ?Reason for Pumping: difficult latch ?Pumping frequency: q 3 hours ? ?Mom has adjusted well to pumping and receiving approximately 54mL (combined) at each pumping session.  ? ?Interventions ?Interventions: Breast feeding basics reviewed;Skin to skin;Hand express;DEBP;Education;Pace feeding ? ?Discharge ?Discharge Education: Engorgement and breast care;Outpatient recommendation;Warning signs for feeding baby ?Pump: Hands Free;Personal ? ?Mom plans to continue working with baby at the breast, feels confident in positioning/latching, pumping, and providing supplement as needed. ?Reviewed potential breast changes, management of breast fullness/engorgement, encouraged frequent milk removal. ? ?Consult Status ?Consult Status: Complete ?Date: 06/09/21 ?Follow-up type: Call as needed ? ?Outpatient lactation service information provided as well as community breastfeeding resources. ? ?Lavonia Drafts ?06/09/2021, 9:25 AM ? ? ? ?

## 2021-06-09 NOTE — Anesthesia Postprocedure Evaluation (Signed)
Anesthesia Post Note ? ?Patient: Tala Eber ? ?Procedure(s) Performed: AN AD HOC LABOR EPIDURAL ? ?Patient location during evaluation: Mother Baby ?Anesthesia Type: Epidural ?Level of consciousness: awake and alert ?Pain management: pain level controlled ?Vital Signs Assessment: post-procedure vital signs reviewed and stable ?Respiratory status: spontaneous breathing, nonlabored ventilation and respiratory function stable ?Cardiovascular status: stable ?Postop Assessment: no headache, no backache and epidural receding ?Anesthetic complications: no ? ? ?No notable events documented. ? ? ?Last Vitals:  ?Vitals:  ? 06/08/21 2327 06/09/21 0737  ?BP: 97/83 131/81  ?Pulse: 72 92  ?Resp: 20 18  ?Temp: 37.1 ?C 37.2 ?C  ?SpO2: 97% 98%  ?  ?Last Pain:  ?Vitals:  ? 06/09/21 0806  ?TempSrc:   ?PainSc: 1   ? ? ?  ?  ?  ?  ?  ?  ? ?Emry Barbato B Azura Tufaro ? ? ? ? ?

## 2022-01-10 ENCOUNTER — Ambulatory Visit
Admission: EM | Admit: 2022-01-10 | Discharge: 2022-01-10 | Disposition: A | Discharge status: Home / Self Care | Payer: Medicaid Other | Attending: Physician Assistant | Admitting: Physician Assistant

## 2022-01-10 DIAGNOSIS — R0981 Nasal congestion: Secondary | ICD-10-CM | POA: Diagnosis not present

## 2022-01-10 DIAGNOSIS — J019 Acute sinusitis, unspecified: Secondary | ICD-10-CM

## 2022-01-10 MED ORDER — DOXYCYCLINE HYCLATE 100 MG PO CAPS: 100.0000 mg | ORAL_CAPSULE | Freq: Two times a day (BID) | ORAL | 0 refills | Status: AC

## 2022-01-10 NOTE — ED Provider Notes (Signed)
[facility]    CSN: [id number] Arrival date & time: 01/10/22  1100      History   Chief Complaint Chief Complaint  Patient presents with   Nasal Congestion    Facial pain, nasal congestion, and right ear pain. X2-3 days    HPI Chelsea Underwood is a 26 y.o. female presenting for 2 to 3-day history of facial pain and pressure, nasal congestion and right-sided ear pain.  She reports that she was diagnosed with COVID-19 about 2-1/2 weeks ago.  Reports she improved from that and over the past few days became ill again.  She denies any associated fevers.  Has a mild cough.  No chest pain or breathing difficulty.  History of asthma.  Has been taking multiple over-the-counter decongestants without relief of symptoms.  Believes she may have a sinus infection.  No other complaints.  Patient recently had a baby but states she is not breast-feeding.  HPI  Past Medical History:  Diagnosis Date   Asthma     Patient Active Problem List   Diagnosis Date Noted   Abdominal complaints 06/07/2021   PROM (premature rupture of membranes) 06/07/2021   Vaginal discharge during pregnancy in third trimester 05/18/2021   Uterine contractions 04/15/2021   Acute abdominal pain in right lower quadrant 12/21/2020   Encounter for supervision of other normal pregnancy, third trimester 11/20/2020   Migraine 02/23/2018   Asthma 05/18/2016    Past Surgical History:  Procedure Laterality Date   XI ROBOTIC LAPAROSCOPIC ASSISTED APPENDECTOMY N/A 12/22/2020   Procedure: XI ROBOTIC LAPAROSCOPIC ASSISTED APPENDECTOMY;  Surgeon: Herbert Pun, MD;  Location: [facility] ORS;  Service: General;  Laterality: N/A;    OB History     Gravida  2   Para  1   Term  1   Preterm      AB  1   Living  1      SAB      IAB      Ectopic      Multiple  0   Live Births  1            Home Medications    Prior to Admission medications   Medication Sig Start Date End Date Taking?  Authorizing Provider  acetaminophen (TYLENOL) 500 MG tablet Take 2 tablets (1,000 mg total) by mouth every 6 (six) hours as needed (for pain scale < 4). 06/09/21  Yes McVey, Wells Guiles A, CNM  benzocaine-Menthol (DERMOPLAST) 20-0.5 % AERO Apply 1 application. topically as needed for irritation (perineal discomfort). 06/09/21  Yes McVey, Murray Hodgkins, CNM  coconut oil OIL Apply 1 application. topically as needed. 06/09/21  Yes McVey, Murray Hodgkins, CNM  diphenhydrAMINE (BENADRYL) 25 mg capsule Take 1 capsule (25 mg total) by mouth every 6 (six) hours as needed for itching. 06/09/21  Yes McVey, Murray Hodgkins, CNM  docusate sodium (COLACE) 100 MG capsule Take 1 capsule (100 mg total) by mouth 2 (two) times daily. 06/09/21  Yes McVey, Murray Hodgkins, CNM  doxycycline (VIBRAMYCIN) 100 MG capsule Take 1 capsule (100 mg total) by mouth 2 (two) times daily for 7 days. 01/10/22 01/17/22 Yes Laurene Footman B, PA-C  EPINEPHrine 0.3 mg/0.3 mL IJ SOAJ injection epinephrine 0.3 mg/0.3 mL injection, auto-injector  INJECT 0.3 ML (0.3 MG TOTAL) UNDER THE SKIN ONCE FOR 1 DOSE. 02/05/17  Yes [provider]  ibuprofen (ADVIL) 600 MG tablet Take 1 tablet (600 mg total) by mouth every 6 (six) hours. 06/09/21  Yes McVey, Wells Guiles  A, CNM  Prenatal 28-0.8 MG TABS Take 1 tablet by mouth daily.   Yes [provider]  simethicone (MYLICON) 80 MG chewable tablet Chew 1 tablet (80 mg total) by mouth as needed for flatulence. 06/09/21  Yes McVey, Prudencio Pair, CNM  witch hazel-glycerin (TUCKS) pad Apply 1 application. topically continuous. 06/09/21  Yes McVey, Prudencio Pair, CNM    Family History History reviewed. No pertinent family history.  Social History Social History   Tobacco Use   Smoking status: Never   Smokeless tobacco: Never  Substance Use Topics   Alcohol use: Not Currently   Drug use: Not Currently     Allergies   Amoxicillin, Cefdinir, Other, Peanut butter flavor, Peanut oil, Penicillin v, Apple juice,  Peanut-containing drug products, and Penicillins   Review of Systems Review of Systems  Constitutional:  Negative for chills, diaphoresis, fatigue and fever.  HENT:  Positive for congestion, ear pain, rhinorrhea, sinus pressure and sinus pain. Negative for sore throat.   Respiratory:  Positive for cough. Negative for shortness of breath.   Gastrointestinal:  Negative for abdominal pain, nausea and vomiting.  Musculoskeletal:  Negative for arthralgias and myalgias.  Skin:  Negative for rash.  Neurological:  Negative for weakness and headaches.  Hematological:  Negative for adenopathy.     Physical Exam Triage Vital Signs ED Triage Vitals  Enc Vitals Group     BP 01/10/22 1208 108/75     Pulse Rate 01/10/22 1208 87     Resp 01/10/22 1208 20     Temp 01/10/22 1208 98.6 F (37 C)     Temp Source 01/10/22 1208 Oral     SpO2 01/10/22 1208 100 %     Weight 01/10/22 1206 210 lb (95.3 kg)     Height 01/10/22 1206 5\' 3"  (1.6 m)     Head Circumference --      Peak Flow --      Pain Score 01/10/22 1206 5     Pain Loc --      Pain Edu? --      Excl. in GC? --    No data found.  Updated Vital Signs BP 108/75 (BP Location: Right Arm)   Pulse 87   Temp 98.6 F (37 C) (Oral)   Resp 20   Ht 5\' 3"  (1.6 m)   Wt 210 lb (95.3 kg)   LMP 01/07/2022   SpO2 100%   BMI 37.20 kg/m       Physical Exam Vitals and nursing note reviewed.  Constitutional:      General: She is not in acute distress.    Appearance: Normal appearance. She is not ill-appearing or toxic-appearing.  HENT:     Head: Normocephalic and atraumatic.     Right Ear: Tympanic membrane, ear canal and external ear normal.     Left Ear: Tympanic membrane, ear canal and external ear normal.     Nose: Congestion present.     Right Sinus: Maxillary sinus tenderness present.     Left Sinus: Maxillary sinus tenderness present.     Mouth/Throat:     Mouth: Mucous membranes are moist.     Pharynx: Oropharynx is clear.   Eyes:     General: No scleral icterus.       Right eye: No discharge.        Left eye: No discharge.     Conjunctiva/sclera: Conjunctivae normal.  Cardiovascular:     Rate and Rhythm: Normal rate and regular rhythm.  Heart sounds: Normal heart sounds.  Pulmonary:     Effort: Pulmonary effort is normal. No respiratory distress.     Breath sounds: Normal breath sounds.  Musculoskeletal:     Cervical back: Neck supple.  Skin:    General: Skin is dry.  Neurological:     General: No focal deficit present.     Mental Status: She is alert. Mental status is at baseline.     Motor: No weakness.     Gait: Gait normal.  Psychiatric:        Mood and Affect: Mood normal.        Behavior: Behavior normal.        Thought Content: Thought content normal.      UC Treatments / Results  Labs (all labs ordered are listed, but only abnormal results are displayed) Labs Reviewed - No data to display  EKG   Radiology No results found.  Procedures Procedures (including critical care time)  Medications Ordered in UC Medications - No data to display  Initial Impression / Assessment and Plan / UC Course  I have reviewed the triage vital signs and the nursing notes.  Pertinent labs & imaging results that were available during my care of the patient were reviewed by me and considered in my medical decision making (see chart for details).   26 year old female presents for 2 to 3-day history of sinus pain and pressure, nasal congestion and right-sided ear pain.  Diagnosed with COVID-19 about 2 and half weeks ago.  Symptoms improved and then she became ill again.  No fever.  Vitals normal stable and she is overall well-appearing.  On exam she has nasal congestion and sinus tenderness.  Chest clear auscultation heart regular rate and rhythm.  Suspect secondary bacterial sinusitis.  We will treat with doxycycline since she has allergy to penicillins and cephalosporins.  Patient states she is  not breast-feeding.  Also advised Mucinex D, Flonase, nasal saline and sinus rinses.  Reviewed return precautions.   Final Clinical Impressions(s) / UC Diagnoses   Final diagnoses:  Acute sinusitis, recurrence not specified, unspecified location  Nasal congestion     Discharge Instructions      -I sent an antibiotic to the pharmacy for sinus infection.  You stated that you are not breast-feeding so this medication should be fine. - Take Mucinex and use Flonase.  Plenty of rest and fluids.  You should be feeling better over the next week.     ED Prescriptions     Medication Sig Dispense Auth. Provider   doxycycline (VIBRAMYCIN) 100 MG capsule Take 1 capsule (100 mg total) by mouth 2 (two) times daily for 7 days. 14 capsule Danton Clap, PA-C      PDMP not reviewed this encounter.   Danton Clap, PA-C 01/10/22 1245

## 2022-01-10 NOTE — Discharge Instructions (Addendum)
-  I sent an antibiotic to the pharmacy for sinus infection.  You stated that you are not breast-feeding so this medication should be fine. - Take Mucinex and use Flonase.  Plenty of rest and fluids.  You should be feeling better over the next week.

## 2022-01-10 NOTE — ED Triage Notes (Signed)
Pt states that she has facial pain, nasal congestion, and ear pain. X2-3 days
